# Patient Record
Sex: Male | Born: 1950 | Race: White | Hispanic: No | Marital: Married | Smoking: Former smoker
Health system: Southern US, Community
[De-identification: ages and names within clinical notes are randomized; demographics above are authoritative.]

## PROBLEM LIST (undated history)

## (undated) DIAGNOSIS — N400 Enlarged prostate without lower urinary tract symptoms: Secondary | ICD-10-CM

## (undated) DIAGNOSIS — G709 Myoneural disorder, unspecified: Secondary | ICD-10-CM

## (undated) DIAGNOSIS — F419 Anxiety disorder, unspecified: Secondary | ICD-10-CM

## (undated) DIAGNOSIS — M1990 Unspecified osteoarthritis, unspecified site: Secondary | ICD-10-CM

## (undated) DIAGNOSIS — F32A Depression, unspecified: Secondary | ICD-10-CM

## (undated) DIAGNOSIS — K219 Gastro-esophageal reflux disease without esophagitis: Secondary | ICD-10-CM

## (undated) DIAGNOSIS — G809 Cerebral palsy, unspecified: Secondary | ICD-10-CM

## (undated) DIAGNOSIS — F329 Major depressive disorder, single episode, unspecified: Secondary | ICD-10-CM

## (undated) DIAGNOSIS — I10 Essential (primary) hypertension: Secondary | ICD-10-CM

## (undated) DIAGNOSIS — R0600 Dyspnea, unspecified: Secondary | ICD-10-CM

## (undated) DIAGNOSIS — Z8679 Personal history of other diseases of the circulatory system: Secondary | ICD-10-CM

## (undated) DIAGNOSIS — E785 Hyperlipidemia, unspecified: Secondary | ICD-10-CM

## (undated) DIAGNOSIS — Z8669 Personal history of other diseases of the nervous system and sense organs: Secondary | ICD-10-CM

## (undated) DIAGNOSIS — E119 Type 2 diabetes mellitus without complications: Secondary | ICD-10-CM

## (undated) HISTORY — DX: Personal history of other diseases of the nervous system and sense organs: Z86.69

## (undated) HISTORY — DX: Gastro-esophageal reflux disease without esophagitis: K21.9

## (undated) HISTORY — DX: Anxiety disorder, unspecified: F41.9

## (undated) HISTORY — PX: COLONOSCOPY: SHX174

## (undated) HISTORY — PX: APPENDECTOMY: SHX54

## (undated) HISTORY — DX: Hyperlipidemia, unspecified: E78.5

## (undated) HISTORY — DX: Cerebral palsy, unspecified: G80.9

## (undated) HISTORY — DX: Type 2 diabetes mellitus without complications: E11.9

## (undated) HISTORY — DX: Essential (primary) hypertension: I10

## (undated) HISTORY — PX: UPPER GASTROINTESTINAL ENDOSCOPY: SHX188

## (undated) HISTORY — PX: POLYPECTOMY: SHX149

## (undated) HISTORY — DX: Benign prostatic hyperplasia without lower urinary tract symptoms: N40.0

## (undated) HISTORY — DX: Depression, unspecified: F32.A

## (undated) HISTORY — DX: Unspecified osteoarthritis, unspecified site: M19.90

## (undated) HISTORY — DX: Personal history of other diseases of the circulatory system: Z86.79

## (undated) HISTORY — PX: ATRIAL FIBRILLATION ABLATION: EP1191

## (undated) HISTORY — DX: Myoneural disorder, unspecified: G70.9

---
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
## 2008-01-29 DIAGNOSIS — I499 Cardiac arrhythmia, unspecified: Secondary | ICD-10-CM

## 2008-01-29 HISTORY — DX: Cardiac arrhythmia, unspecified: I49.9

## 2011-01-29 HISTORY — PX: CARDIAC CATHETERIZATION: SHX172

## 2016-05-15 ENCOUNTER — Other Ambulatory Visit: Payer: Self-pay | Admitting: Internal Medicine

## 2016-05-15 DIAGNOSIS — Z87891 Personal history of nicotine dependence: Secondary | ICD-10-CM

## 2016-05-20 ENCOUNTER — Ambulatory Visit: Payer: Self-pay

## 2016-06-14 ENCOUNTER — Encounter: Payer: Self-pay | Admitting: Internal Medicine

## 2017-05-30 ENCOUNTER — Other Ambulatory Visit: Payer: Self-pay | Admitting: Internal Medicine

## 2017-05-30 DIAGNOSIS — Z87891 Personal history of nicotine dependence: Secondary | ICD-10-CM

## 2017-06-13 ENCOUNTER — Ambulatory Visit: Payer: Medicare Other | Admitting: Podiatry

## 2017-06-13 ENCOUNTER — Ambulatory Visit (INDEPENDENT_AMBULATORY_CARE_PROVIDER_SITE_OTHER): Payer: Medicare Other

## 2017-06-13 DIAGNOSIS — M79672 Pain in left foot: Secondary | ICD-10-CM | POA: Diagnosis not present

## 2017-06-13 DIAGNOSIS — M779 Enthesopathy, unspecified: Secondary | ICD-10-CM | POA: Diagnosis not present

## 2017-06-13 DIAGNOSIS — M7742 Metatarsalgia, left foot: Secondary | ICD-10-CM

## 2017-06-13 MED ORDER — MELOXICAM 7.5 MG PO TABS: 7.5000 mg | ORAL_TABLET | Freq: Every day | ORAL | 0 refills | Status: DC

## 2017-06-20 ENCOUNTER — Encounter: Payer: Self-pay | Admitting: Internal Medicine

## 2017-07-24 ENCOUNTER — Other Ambulatory Visit: Payer: Self-pay | Admitting: Podiatry

## 2017-08-13 ENCOUNTER — Other Ambulatory Visit: Payer: Self-pay | Admitting: Podiatry

## 2017-08-17 NOTE — Progress Notes (Signed)
  Subjective:  Patient ID: Bradley Ballard, male    DOB: 11/29/1950,  MRN: [id number]  Chief Complaint  Patient presents with  . Foot Pain    left foot pain   67 y.o. male presents with the above complaint.  Reports sharp pain in the left heel and ball of foot.  No past medical history on file.  Current Outpatient Medications:  .  meloxicam (MOBIC) 7.5 MG tablet, TAKE 1 TABLET(7.5 MG) BY MOUTH DAILY, Disp: 90 tablet, Rfl: 0  Allergies not on file Review of Systems: Negative except as noted in the HPI. Denies N/V/F/Ch. Objective:  There were no vitals filed for this visit. General AA&O x3. Normal mood and affect.  Vascular Dorsalis pedis and posterior tibial pulses  present 2+ bilaterally  Capillary refill normal to all digits. Pedal hair growth normal.  Neurologic Epicritic sensation grossly present.  Dermatologic No open lesions. Interspaces clear of maceration. Nails well groomed and normal in appearance.  Orthopedic: MMT 5/5 in dorsiflexion, plantarflexion, inversion, and eversion. Normal joint ROM without pain or crepitus. Pain palpation about the ball of foot left   Assessment & Plan:  Patient was evaluated and treated and all questions answered.  Metatarsalgia -X-rays taken reviewed no acute fractures dislocations -Rx Mobic -Dispensed power step inserts  Return in about 6 weeks (around 07/25/2017).

## 2017-10-01 ENCOUNTER — Other Ambulatory Visit: Payer: Self-pay | Admitting: Sports Medicine

## 2018-02-12 ENCOUNTER — Other Ambulatory Visit: Payer: Self-pay | Admitting: Sports Medicine

## 2019-01-12 ENCOUNTER — Encounter: Payer: Self-pay | Admitting: Gastroenterology

## 2019-01-12 ENCOUNTER — Other Ambulatory Visit: Payer: Self-pay | Admitting: Internal Medicine

## 2019-01-12 DIAGNOSIS — Z0000 Encounter for general adult medical examination without abnormal findings: Secondary | ICD-10-CM

## 2019-01-28 ENCOUNTER — Ambulatory Visit
Admission: RE | Admit: 2019-01-28 | Discharge: 2019-01-28 | Disposition: A | Discharge status: Home / Self Care | Payer: Medicare Other | Source: Ambulatory Visit | Attending: Internal Medicine | Admitting: Internal Medicine

## 2019-01-28 DIAGNOSIS — Z0000 Encounter for general adult medical examination without abnormal findings: Secondary | ICD-10-CM

## 2019-02-05 ENCOUNTER — Other Ambulatory Visit: Payer: Self-pay

## 2019-02-05 ENCOUNTER — Ambulatory Visit (AMBULATORY_SURGERY_CENTER): Payer: Medicare Other | Admitting: *Deleted

## 2019-02-05 VITALS — Ht 69.5 in | Wt 275.0 lb

## 2019-02-05 DIAGNOSIS — Z1211 Encounter for screening for malignant neoplasm of colon: Secondary | ICD-10-CM

## 2019-02-05 DIAGNOSIS — Z1159 Encounter for screening for other viral diseases: Secondary | ICD-10-CM

## 2019-02-05 MED ORDER — SUPREP BOWEL PREP KIT 17.5-3.13-1.6 GM/177ML PO SOLN: 1.0000 | Freq: Once | ORAL | 0 refills | Status: AC

## 2019-02-05 NOTE — Progress Notes (Signed)
Pt verified name, DOB, address and insurance during PV today. Pt mailed instruction packet to included paper to complete and mail back to [facility] with addressed and stamped envelope, Emmi video, copy of consent form to read and not return, and instructions. PV completed over the phone. Pt encouraged to call with questions or issues    No egg or soy allergy known to patient  No issues with past sedation with any surgeries  or procedures, no intubation problems  No diet pills per patient No home 02 use per patient  No blood thinners per patient  Pt denies issues with constipation  No A fib or A flutter  EMMI video sent to pt's e mail   Due to the COVID-19 pandemic we are asking patients to follow these guidelines. Please only bring one care partner. Please be aware that your care partner may wait in the car in the parking lot or if they feel like they will be too hot to wait in the car, they may wait in the lobby on the 4th floor. All care partners are required to wear a mask the entire time (we do not have any that we can provide them), they need to practice social distancing, and we will do a Covid check for all patient's and care partners when you arrive. Also we will check their temperature and your temperature. If the care partner waits in their car they need to stay in the parking lot the entire time and we will call them on their cell phone when the patient is ready for discharge so they can bring the car to the front of the building. Also all patient's will need to wear a mask into building.  

## 2019-02-09 ENCOUNTER — Encounter: Payer: Self-pay | Admitting: Gastroenterology

## 2019-02-16 ENCOUNTER — Ambulatory Visit (INDEPENDENT_AMBULATORY_CARE_PROVIDER_SITE_OTHER): Payer: Medicare Other

## 2019-02-16 ENCOUNTER — Other Ambulatory Visit: Payer: Self-pay | Admitting: Gastroenterology

## 2019-02-16 DIAGNOSIS — Z1159 Encounter for screening for other viral diseases: Secondary | ICD-10-CM

## 2019-02-17 LAB — SARS CORONAVIRUS 2 (TAT 6-24 HRS): SARS Coronavirus 2: NEGATIVE

## 2019-02-19 ENCOUNTER — Other Ambulatory Visit: Payer: Self-pay

## 2019-02-19 ENCOUNTER — Encounter: Payer: Self-pay | Admitting: Gastroenterology

## 2019-02-19 ENCOUNTER — Ambulatory Visit (AMBULATORY_SURGERY_CENTER): Payer: Medicare Other | Admitting: Gastroenterology

## 2019-02-19 VITALS — BP 101/62 | HR 56 | Temp 97.6°F | Resp 13 | Ht 69.0 in | Wt 275.0 lb

## 2019-02-19 DIAGNOSIS — Z1211 Encounter for screening for malignant neoplasm of colon: Secondary | ICD-10-CM

## 2019-02-19 MED ORDER — SODIUM CHLORIDE 0.9 % IV SOLN: 500.0000 mL | Freq: Once | INTRAVENOUS | Status: DC

## 2019-02-19 NOTE — Patient Instructions (Signed)
Handout on diverticulosis given   YOU HAD AN ENDOSCOPIC PROCEDURE TODAY AT THE  ENDOSCOPY CENTER:   Refer to the procedure report that was given to you for any specific questions about what was found during the examination.  If the procedure report does not answer your questions, please call your gastroenterologist to clarify.  If you requested that your care partner not be given the details of your procedure findings, then the procedure report has been included in a sealed envelope for you to review at your convenience later.  YOU SHOULD EXPECT: Some feelings of bloating in the abdomen. Passage of more gas than usual.  Walking can help get rid of the air that was put into your GI tract during the procedure and reduce the bloating. If you had a lower endoscopy (such as a colonoscopy or flexible sigmoidoscopy) you may notice spotting of blood in your stool or on the toilet paper. If you underwent a bowel prep for your procedure, you may not have a normal bowel movement for a few days.  Please Note:  You might notice some irritation and congestion in your nose or some drainage.  This is from the oxygen used during your procedure.  There is no need for concern and it should clear up in a day or so.  SYMPTOMS TO REPORT IMMEDIATELY:   Following lower endoscopy (colonoscopy or flexible sigmoidoscopy):  Excessive amounts of blood in the stool  Significant tenderness or worsening of abdominal pains  Swelling of the abdomen that is new, acute  Fever of 100F or higher   For urgent or emergent issues, a gastroenterologist can be reached at any hour by calling [phone].   DIET:  We do recommend a small meal at first, but then you may proceed to your regular diet.  Drink plenty of fluids but you should avoid alcoholic beverages for 24 hours.  ACTIVITY:  You should plan to take it easy for the rest of today and you should NOT DRIVE or use heavy machinery until tomorrow (because of the  sedation medicines used during the test).    FOLLOW UP: Our staff will call the number listed on your records 48-72 hours following your procedure to check on you and address any questions or concerns that you may have regarding the information given to you following your procedure. If we do not reach you, we will leave a message.  We will attempt to reach you two times.  During this call, we will ask if you have developed any symptoms of COVID 19. If you develop any symptoms (ie: fever, flu-like symptoms, shortness of breath, cough etc.) before then, please call [phone].  If you test positive for Covid 19 in the 2 weeks post procedure, please call and report this information to us.    If any biopsies were taken you will be contacted by phone or by letter within the next 1-3 weeks.  Please call us at [phone] if you have not heard about the biopsies in 3 weeks.    SIGNATURES/CONFIDENTIALITY: You and/or your care partner have signed paperwork which will be entered into your electronic medical record.  These signatures attest to the fact that that the information above on your After Visit Summary has been reviewed and is understood.  Full responsibility of the confidentiality of this discharge information lies with you and/or your care-partner. 

## 2019-02-19 NOTE — Op Note (Signed)
[employer] Patient Name: Bradley Ballard Procedure Date: 02/19/2019 10:28 AM MRN: [id number] Endoscopist: Jaconita. Loletha Carrow , MD Age: 69 Referring MD:  Date of Birth: 1950/07/27 Gender: Male Account #: [id number] Procedure:                Colonoscopy Indications:              Screening for colorectal malignant neoplasm Medicines:                Monitored Anesthesia Care Procedure:                Pre-Anesthesia Assessment:                           - Prior to the procedure, a History and Physical                            was performed, and patient medications and                            allergies were reviewed. The patient's tolerance of                            previous anesthesia was also reviewed. The risks                            and benefits of the procedure and the sedation                            options and risks were discussed with the patient.                            All questions were answered, and informed consent                            was obtained. Prior Anticoagulants: The patient has                            taken no previous anticoagulant or antiplatelet                            agents except for aspirin. ASA Grade Assessment:                            III - A patient with severe systemic disease. After                            reviewing the risks and benefits, the patient was                            deemed in satisfactory condition to undergo the                            procedure.  After obtaining informed consent, the colonoscope                            was passed under direct vision. Throughout the                            procedure, the patient's blood pressure, pulse, and                            oxygen saturations were monitored continuously. The                            Colonoscope was introduced through the anus and                            advanced to the the cecum, identified by                       appendiceal orifice and ileocecal valve. The                            colonoscopy was performed without difficulty. The                            patient tolerated the procedure well. The quality                            of the bowel preparation was good. The ileocecal                            valve, appendiceal orifice, and rectum were                            photographed. Scope In: 10:56:42 AM Scope Out: 11:08:11 AM Scope Withdrawal Time: 0 hours 8 minutes 13 seconds  Total Procedure Duration: 0 hours 11 minutes 29 seconds  Findings:                 The perianal and digital rectal examinations were                            normal.                           Multiple diverticula were found in the left colon.                           The exam was otherwise without abnormality on                            direct and retroflexion views. Complications:            No immediate complications. Estimated Blood Loss:     Estimated blood loss: none. Impression:               - Diverticulosis in the left colon.                           -  The examination was otherwise normal on direct                            and retroflexion views.                           - No specimens collected. Recommendation:           - Patient has a contact number available for                            emergencies. The signs and symptoms of potential                            delayed complications were discussed with the                            patient. Return to normal activities tomorrow.                            Written discharge instructions were provided to the                            patient.                           - Resume previous diet.                           - Continue present medications.                           - Based on current guidelines, no repeat screening                            colonoscopy. Kruz Chiu L. Myrtie Neither, MD 02/19/2019 11:15:17 AM This report  has been signed electronically.

## 2019-02-19 NOTE — Progress Notes (Signed)
A and O x3. Report to RN. Tolerated MAC anesthesia well.

## 2019-02-19 NOTE — Progress Notes (Signed)
Temp  LC  VS  KA  Pt's states no medical or surgical changes since previsit or office visit.  Admitting RN reviewed

## 2019-02-23 ENCOUNTER — Telehealth: Payer: Self-pay

## 2019-02-23 ENCOUNTER — Telehealth: Payer: Self-pay | Admitting: *Deleted

## 2019-02-23 NOTE — Telephone Encounter (Signed)
  Follow up Call-  Call back number 02/19/2019  Post procedure Call Back phone  # [phone]  Permission to leave phone message Yes  Some recent data might be hidden     Patient questions:  Do you have a fever, pain , or abdominal swelling? No. Pain Score  0 *  Have you tolerated food without any problems? Yes.    Have you been able to return to your normal activities? Yes.    Do you have any questions about your discharge instructions: Diet   No. Medications  No. Follow up visit  No.  Do you have questions or concerns about your Care? No.  Actions: * If pain score is 4 or above: No action needed, pain <4.  1. Have you developed a fever since your procedure? no  2.   Have you had an respiratory symptoms (SOB or cough) since your procedure? no  3.   Have you tested positive for COVID 19 since your procedure no  4.   Have you had any family members/close contacts diagnosed with the COVID 19 since your procedure?  no   If yes to any of these questions please route to Laverna Peace, RN and Jennye Boroughs, Charity fundraiser.

## 2019-02-23 NOTE — Telephone Encounter (Signed)
Attempted to reach patient for post-procedure f/u call. No answer. Left message that we will make another attempt to reach him again later today and for him to please not hesitate to call us if he has any questions/concerns regarding his care. 

## 2019-03-24 ENCOUNTER — Other Ambulatory Visit: Payer: Self-pay | Admitting: Urology

## 2019-03-29 NOTE — Patient Instructions (Addendum)
DUE TO COVID-19 ONLY ONE VISITOR IS ALLOWED TO COME WITH YOU AND STAY IN THE WAITING ROOM ONLY DURING PRE OP AND PROCEDURE DAY OF SURGERY. THE 1 VISITOR MAY VISIT WITH YOU AFTER SURGERY IN YOUR PRIVATE ROOM DURING VISITING HOURS ONLY!  YOU NEED TO HAVE A COVID 19 TEST ON: 04/02/2019 @ 8:50 am  , THIS TEST MUST BE DONE BEFORE SURGERY, COME  [place], [place] , [address].  ([place]) ONCE YOUR COVID TEST IS COMPLETED, PLEASE BEGIN THE QUARANTINE INSTRUCTIONS AS OUTLINED IN YOUR HANDOUT.                Delroy Ordway   Your procedure is scheduled on: 04/06/2019   Report to [facility] Main  Entrance   Report to admitting at : 6:45 AM     Call this number if you have problems the morning of surgery [phone]    Remember: Do not eat SOLID FOOD :After Midnight.fROM MIDNIGHT UNTIL 4:30 AM YOU CAN HAVE CLEAR LIQUIDS.     CLEAR LIQUID DIET   Foods Allowed                                                                     Foods Excluded  Coffee and tea, regular and decaf                             liquids that you cannot  Plain Jell-O any favor except red or purple                                           see through such as: Fruit ices (not with fruit pulp)                                     milk, soups, orange juice  Iced Popsicles                                    All solid food Carbonated beverages, regular and diet                                    Cranberry, grape and apple juices Sports drinks like Gatorade Lightly seasoned clear broth or consume(fat free) Sugar, honey syrup  Sample Menu Breakfast                                Lunch                                     Supper Cranberry juice                    Beef broth  Chicken broth Jell-O                                     Grape juice                           Apple juice Coffee or tea                        Jell-O                                       Popsicle                                                Coffee or tea                        Coffee or tea  _____________________________________________________________________   How to Manage Your Diabetes Before and After Surgery  Why is it important to control my blood sugar before and after surgery? . Improving blood sugar levels before and after surgery helps healing and can limit problems. . A way of improving blood sugar control is eating a healthy diet by: o  Eating less sugar and carbohydrates o  Increasing activity/exercise o  Talking with your doctor about reaching your blood sugar goals . High blood sugars (greater than 180 mg/dL) can raise your risk of infections and slow your recovery, so you will need to focus on controlling your diabetes during the weeks before surgery. . Make sure that the doctor who takes care of your diabetes knows about your planned surgery including the date and location.  How do I manage my blood sugar before surgery? . Check your blood sugar at least 4 times a day, starting 2 days before surgery, to make sure that the level is not too high or low. o Check your blood sugar the morning of your surgery when you wake up and every 2 hours until you get to the Short Stay unit. . If your blood sugar is less than 70 mg/dL, you will need to treat for low blood sugar: o Do not take insulin. o Treat a low blood sugar (less than 70 mg/dL) with  cup of clear juice (cranberry or apple), 4 glucose tablets, OR glucose gel. o Recheck blood sugar in 15 minutes after treatment (to make sure it is greater than 70 mg/dL). If your blood sugar is not greater than 70 mg/dL on recheck, call [phone] for further instructions. . Report your blood sugar to the short stay nurse when you get to Short Stay.  . If you are admitted to the hospital after surgery: o Your blood sugar will be checked by the staff and you will probably be given insulin after surgery (instead of  oral diabetes medicines) to make sure you have good blood sugar levels. o The goal for blood sugar control after surgery is 80-180 mg/dL.   WHAT DO I DO ABOUT MY DIABETES MEDICATION?  Marland Kitchen Do not take oral diabetes medicines (pills) the morning of surgery.  . THE DAY BEFORE SURGERY: TAKE METFORMIN AS USUAL     .  THE MORNING OF SURGERY: DO NOT TAKE METFORMIN     Take these medicines the morning of surgery with A SIP OF WATER: BUPROPION,DULOXETINE,FENOFIBRATE,GABAPENTIN,TAMSULOSIN.FLONASE  AND EYE DROPS AS NEEDED.   DO NOT TAKE ANY DIABETIC MEDICATIONS DAY OF YOUR SURGERY       BRUSH YOUR TEETH MORNING OF SURGERY AND RINSE YOUR MOUTH OUT, NO CHEWING GUM CANDY OR MINTS.                             You may not have any metal on your body including hair pins and              piercings  Do not wear jewelry, lotions, powders or perfumes, deodorant             Men may shave face and neck.   Do not bring valuables to the hospital. [facility] IS NOT             RESPONSIBLE   FOR VALUABLES.  Contacts, dentures or bridgework may not be worn into surgery.  Leave suitcase in the car. After surgery it may be brought to your room.     Patients discharged the day of surgery will not be allowed to drive home. IF YOU ARE HAVING SURGERY AND GOING HOME THE SAME DAY, YOU MUST HAVE AN ADULT TO DRIVE YOU HOME AND BE WITH YOU FOR 24 HOURS. YOU MAY GO HOME BY TAXI OR UBER OR ORTHERWISE, BUT AN ADULT MUST ACCOMPANY YOU HOME AND STAY WITH YOU FOR 24 HOURS.  Name and phone number of your driver:  Special Instructions: N/A              Please read over the following fact sheets you were given: _____________________________________________________________________             [facility] - Preparing for Surgery Before surgery, you can play an important role.  Because skin is not sterile, your skin needs to be as free of germs as possible.  You can reduce the number of germs on your skin by washing with  CHG (chlorahexidine gluconate) soap before surgery.  CHG is an antiseptic cleaner which kills germs and bonds with the skin to continue killing germs even after washing. Please DO NOT use if you have an allergy to CHG or antibacterial soaps.  If your skin becomes reddened/irritated stop using the CHG and inform your nurse when you arrive at Short Stay. Do not shave (including legs and underarms) for at least 48 hours prior to the first CHG shower.  You may shave your face/neck. Please follow these instructions carefully:  1.  Shower with CHG Soap the night before surgery and the  morning of Surgery.  2.  If you choose to wash your hair, wash your hair first as usual with your  normal  shampoo.  3.  After you shampoo, rinse your hair and body thoroughly to remove the  shampoo.                           4.  Use CHG as you would any other liquid soap.  You can apply chg directly  to the skin and wash                       Gently with a scrungie or clean washcloth.  5.  Apply the CHG Soap to your  body ONLY FROM THE NECK DOWN.   Do not use on face/ open                           Wound or open sores. Avoid contact with eyes, ears mouth and genitals (private parts).                       Wash face,  Genitals (private parts) with your normal soap.             6.  Wash thoroughly, paying special attention to the area where your surgery  will be performed.  7.  Thoroughly rinse your body with warm water from the neck down.  8.  DO NOT shower/wash with your normal soap after using and rinsing off  the CHG Soap.                9.  Pat yourself dry with a clean towel.            10.  Wear clean pajamas.            11.  Place clean sheets on your bed the night of your first shower and do not  sleep with pets. Day of Surgery : Do not apply any lotions/deodorants the morning of surgery.  Please wear clean clothes to the hospital/surgery center.  FAILURE TO FOLLOW THESE INSTRUCTIONS MAY RESULT IN THE CANCELLATION  OF YOUR SURGERY PATIENT SIGNATURE_________________________________  NURSE SIGNATURE__________________________________  ________________________________________________________________________

## 2019-03-30 ENCOUNTER — Encounter (HOSPITAL_COMMUNITY): Payer: Self-pay

## 2019-03-30 ENCOUNTER — Other Ambulatory Visit: Payer: Self-pay

## 2019-03-30 ENCOUNTER — Encounter (HOSPITAL_COMMUNITY)
Admission: RE | Admit: 2019-03-30 | Discharge: 2019-03-30 | Disposition: A | Discharge status: Home / Self Care | Payer: Medicare Other | Source: Ambulatory Visit | Attending: Urology | Admitting: Urology

## 2019-03-30 DIAGNOSIS — Z01818 Encounter for other preprocedural examination: Secondary | ICD-10-CM | POA: Insufficient documentation

## 2019-03-30 HISTORY — DX: Dyspnea, unspecified: R06.00

## 2019-03-30 NOTE — Progress Notes (Signed)
PCP - Dr. Cyndie Mull. Cardiologist -   Chest x-ray -  EKG -  Stress Test -  ECHO -  Cardiac Cath -   Sleep Study -  CPAP -   Fasting Blood Sugar -  Checks Blood Sugar _____ times a day  Blood Thinner Instructions: Aspirin Instructions: Pt. Said he will stop the day before surgery. Last Dose:  Anesthesia review:   Patient denies shortness of breath, fever, cough and chest pain at PAT appointment   Patient verbalized understanding of instructions that were given to them at the PAT appointment. Patient was also instructed that they will need to review over the PAT instructions again at home before surgery.

## 2019-03-31 ENCOUNTER — Encounter (HOSPITAL_COMMUNITY)
Admission: RE | Admit: 2019-03-31 | Discharge: 2019-03-31 | Disposition: A | Discharge status: Home / Self Care | Payer: Medicare Other | Source: Ambulatory Visit | Attending: Urology | Admitting: Urology

## 2019-03-31 ENCOUNTER — Encounter (HOSPITAL_COMMUNITY): Payer: Self-pay | Admitting: Physician Assistant

## 2019-03-31 NOTE — Progress Notes (Signed)
RN called pt. Since he did not show for his lab work.Pt. did not answer.Then RN call [facility] and find out that the surgery has been cancelled.

## 2019-04-02 ENCOUNTER — Other Ambulatory Visit (HOSPITAL_COMMUNITY): Payer: Medicare Other

## 2019-04-06 ENCOUNTER — Encounter (HOSPITAL_COMMUNITY): Admission: RE | Payer: Self-pay | Source: Home / Self Care

## 2019-04-06 ENCOUNTER — Inpatient Hospital Stay (HOSPITAL_COMMUNITY): Admission: RE | Admit: 2019-04-06 | Payer: Medicare Other | Source: Home / Self Care | Admitting: Urology

## 2019-04-06 SURGERY — TURP (TRANSURETHRAL RESECTION OF PROSTATE)
Anesthesia: General

## 2019-04-09 ENCOUNTER — Other Ambulatory Visit: Payer: Self-pay | Admitting: Urology

## 2019-05-03 ENCOUNTER — Other Ambulatory Visit: Payer: Self-pay | Admitting: Urology

## 2019-05-03 NOTE — Patient Instructions (Addendum)
DUE TO COVID-19 ONLY ONE VISITOR IS ALLOWED TO COME WITH YOU AND STAY IN THE WAITING ROOM ONLY DURING PRE OP AND PROCEDURE DAY OF SURGERY. THE 1 VISITOR MAY VISIT WITH YOU AFTER SURGERY IN YOUR PRIVATE ROOM DURING VISITING HOURS ONLY!  YOU NEED TO HAVE A COVID 19 TEST ON_4/9______ @_12 :30 p______, THIS TEST MUST BE DONE BEFORE SURGERY, COME  [address], [place] , [address].  (Bradley Ballard) ONCE YOUR COVID TEST IS COMPLETED, PLEASE BEGIN THE QUARANTINE INSTRUCTIONS AS OUTLINED IN YOUR HANDOUT.                Bradley Ballard    Your procedure is scheduled on: 05/11/19   Report to [facility] Main  Entrance   Report to admitting at  6:30 AM     Call this number if you have problems the morning of surgery [phone]    Remember: Do not eat food or drink liquids :After Midnight.   BRUSH YOUR TEETH MORNING OF SURGERY AND RINSE YOUR MOUTH OUT, NO CHEWING GUM CANDY OR MINTS.     Take these medicines the morning of surgery with A SIP OF WATER: Gabapentin, Wellbutrin, Cymbalya,Sildenafil, Finesteride, Tamsulosin and flonase if needed. Eye drops.  DO NOT TAKE ANY DIABETIC MEDICATIONS DAY OF YOUR SURGERY  How to Manage Your Diabetes Before and After Surgery  Why is it important to control my blood sugar before and after surgery? . Improving blood sugar levels before and after surgery helps healing and can limit problems. . A way of improving blood sugar control is eating a healthy diet by: o  Eating less sugar and carbohydrates o  Increasing activity/exercise o  Talking with your doctor about reaching your blood sugar goals . High blood sugars (greater than 180 mg/dL) can raise your risk of infections and slow your recovery, so you will need to focus on controlling your diabetes during the weeks before surgery. . Make sure that the doctor who takes care of your diabetes knows about your planned surgery including the date and location.  How do I manage my blood  sugar before surgery? . Check your blood sugar at least 4 times a day, starting 2 days before surgery, to make sure that the level is not too high or low. o Check your blood sugar the morning of your surgery when you wake up and every 2 hours until you get to the Short Stay unit. . If your blood sugar is less than 70 mg/dL, you will need to treat for low blood sugar: o Do not take insulin. o Treat a low blood sugar (less than 70 mg/dL) with  cup of clear juice (cranberry or apple), 4 glucose tablets, OR glucose gel. o Recheck blood sugar in 15 minutes after treatment (to make sure it is greater than 70 mg/dL). If your blood sugar is not greater than 70 mg/dL on recheck, call [phone] for further instructions. . Report your blood sugar to the short stay nurse when you get to Short Stay.  . If you are admitted to the hospital after surgery: o Your blood sugar will be checked by the staff and you will probably be given insulin after surgery (instead of oral diabetes medicines) to make sure you have good blood sugar levels. o The goal for blood sugar control after surgery is 80-180 mg/dL.   WHAT DO I DO ABOUT MY DIABETES MEDICATION?  Marland Kitchen Do not take oral diabetes medicines (pills) the morning of surgery.  Marland Kitchen  You may not have any metal on your body               piercings  Do not wear jewelry, , lotions, powders or  deodorant                Men may shave face and neck.   Do not bring valuables to the hospital. [facility] IS NOT             RESPONSIBLE   FOR VALUABLES.  Contacts, dentures or bridgework may not be worn into surgery.     Special Instructions: N/A              Please read over the following fact sheets you were given: _____________________________________________________________________             [facility] - Preparing for Surgery Before surgery, you can play an important role .  Because skin is not sterile, your skin needs to be  as free of germs as possible.  You can reduce the number of germs on your skin by washing with CHG (chlorahexidine gluconate) soap before surgery.   CHG is an antiseptic cleaner which kills germs and bonds with the skin to continue killing germs even after washing. Please DO NOT use if you have an allergy to CHG or antibacterial soaps.   If your skin becomes reddened/irritated stop using the CHG and inform your nurse when you arrive at Short Stay.  You may shave your face/neck.  Please follow these instructions carefully:  1.  Shower with CHG Soap the night before surgery and the  morning of Surgery.  2.  If you choose to wash your hair, wash your hair first as usual with your  normal  shampoo.  3.  After you shampoo, rinse your hair and body thoroughly to remove the  shampoo.                                        4.  Use CHG as you would any other liquid soap.  You can apply chg directly  to the skin and wash                       Gently with a scrungie or clean washcloth.  5.  Apply the CHG Soap to your body ONLY FROM THE NECK DOWN.   Do not use on face/ open                           Wound or open sores. Avoid contact with eyes, ears mouth and genitals (private parts).                       Wash face,  Genitals (private parts) with your normal soap.             6.  Wash thoroughly, paying special attention to the area where your surgery  will be performed.  7.  Thoroughly rinse your body with warm water from the neck down.  8.  DO NOT shower/wash with your normal soap after using and rinsing off  the CHG Soap.             9.  Pat yourself dry with a clean towel.            10.  Wear clean pajamas.            11.  Place clean sheets on your bed the night of your first shower and do not  sleep with pets. Day of Surgery : Do not apply any lotions/deodorants the morning of surgery.  Please wear clean clothes to the hospital/surgery center.  FAILURE TO FOLLOW THESE INSTRUCTIONS MAY RESULT IN  THE CANCELLATION OF YOUR SURGERY PATIENT SIGNATURE_________________________________  NURSE SIGNATURE__________________________________  ________________________________________________________________________

## 2019-05-04 ENCOUNTER — Encounter (HOSPITAL_COMMUNITY)
Admission: RE | Admit: 2019-05-04 | Discharge: 2019-05-04 | Disposition: A | Discharge status: Home / Self Care | Payer: Medicare Other | Source: Ambulatory Visit | Attending: Urology | Admitting: Urology

## 2019-05-04 ENCOUNTER — Other Ambulatory Visit: Payer: Self-pay

## 2019-05-04 ENCOUNTER — Other Ambulatory Visit (HOSPITAL_COMMUNITY): Payer: Medicare Other

## 2019-05-04 ENCOUNTER — Encounter (HOSPITAL_COMMUNITY): Payer: Self-pay

## 2019-05-04 DIAGNOSIS — Z01812 Encounter for preprocedural laboratory examination: Secondary | ICD-10-CM | POA: Diagnosis present

## 2019-05-04 DIAGNOSIS — Z01810 Encounter for preprocedural cardiovascular examination: Secondary | ICD-10-CM | POA: Diagnosis present

## 2019-05-04 DIAGNOSIS — E119 Type 2 diabetes mellitus without complications: Secondary | ICD-10-CM | POA: Insufficient documentation

## 2019-05-04 DIAGNOSIS — I4510 Unspecified right bundle-branch block: Secondary | ICD-10-CM | POA: Insufficient documentation

## 2019-05-04 LAB — BASIC METABOLIC PANEL
Anion gap: 10 (ref 5–15)
BUN: 28 mg/dL — ABNORMAL HIGH (ref 8–23)
CO2: 24 mmol/L (ref 22–32)
Calcium: 10 mg/dL (ref 8.9–10.3)
Chloride: 106 mmol/L (ref 98–111)
Creatinine, Ser: 0.93 mg/dL (ref 0.61–1.24)
GFR calc Af Amer: >60 mL/min (ref >60)
GFR calc non Af Amer: >60 mL/min (ref >60)
Glucose, Bld: 187 mg/dL — ABNORMAL HIGH (ref 70–99)
Potassium: 4.3 mmol/L (ref 3.5–5.1)
Sodium: 140 mmol/L (ref 135–145)

## 2019-05-04 LAB — CBC
HCT: 42.3 % (ref 39.0–52.0)
Hemoglobin: 13.5 g/dL (ref 13.0–17.0)
MCH: 30 pg (ref 26.0–34.0)
MCHC: 31.9 g/dL (ref 30.0–36.0)
MCV: 94 fL (ref 80.0–100.0)
Platelets: 300 10*3/uL (ref 150–400)
RBC: 4.5 MIL/uL (ref 4.22–5.81)
RDW: 12.9 % (ref 11.5–15.5)
WBC: 6.5 10*3/uL (ref 4.0–10.5)
nRBC: 0 % (ref 0.0–0.2)

## 2019-05-04 LAB — HEMOGLOBIN A1C
Hgb A1c MFr Bld: 7.9 % — ABNORMAL HIGH (ref 4.8–5.6)
Mean Plasma Glucose: 180.03 mg/dL

## 2019-05-04 LAB — GLUCOSE, CAPILLARY: Glucose-Capillary: 165 mg/dL — ABNORMAL HIGH (ref 70–99)

## 2019-05-04 NOTE — Progress Notes (Signed)
PCP - dr. Cyndie Mull Cardiologist - no  Chest x-ray - no EKG - 05/04/19 Stress Test -no  ECHO - no Cardiac Cath - ablation 2013  Sleep Study - yes CPAP - no  Fasting Blood Sugar - 128-159 Checks Blood Sugar _____ times a day 1/week  Blood Thinner Instructions:ASA Aspirin Instructions:none. Pt told to call Dr. Arita Miss Last Dose:  Anesthesia review:   Patient denies shortness of breath, fever, cough and chest pain at PAT appointment yes  Patient verbalized understanding of instructions that were given to them at the PAT appointment. Patient was also instructed that they will need to review over the PAT instructions again at home before surgery. yes

## 2019-05-07 ENCOUNTER — Other Ambulatory Visit (HOSPITAL_COMMUNITY): Payer: Medicare Other

## 2019-05-08 ENCOUNTER — Other Ambulatory Visit (HOSPITAL_COMMUNITY): Payer: Medicare Other

## 2019-05-10 ENCOUNTER — Other Ambulatory Visit (HOSPITAL_COMMUNITY)
Admission: RE | Admit: 2019-05-10 | Discharge: 2019-05-10 | Disposition: A | Discharge status: Home / Self Care | Payer: Medicare Other | Source: Ambulatory Visit | Attending: Urology | Admitting: Urology

## 2019-05-10 DIAGNOSIS — Z01812 Encounter for preprocedural laboratory examination: Secondary | ICD-10-CM | POA: Insufficient documentation

## 2019-05-10 DIAGNOSIS — Z20822 Contact with and (suspected) exposure to covid-19: Secondary | ICD-10-CM | POA: Diagnosis not present

## 2019-05-10 LAB — SARS CORONAVIRUS 2 (TAT 6-24 HRS): SARS Coronavirus 2: NEGATIVE

## 2019-05-10 NOTE — H&P (Signed)
CC/HPI: cc: LUTs   03/11/19: 69 year old man with history of BPH and lower urinary tract symptoms on finasteride and Flomax with persistent urinary urgency and urge incontinence. Patient has cystoscopy which showed significantly enlarged lateral lobes with the median lobe. He underwent transrectal ultrasound of prostate today for size which showed 169 g prostate.   02/04/19: 69 year old man with lower urinary tract symptoms on finasteride and Flomax for several years. Patient's biggest issue is urinary urgency and urge incontinence. He reports soiling his clothes 3 times a day as well as at night. He has nocturia 3-4 times at night. His most recent PSA is 1.9 to corrected to 3.84. Patient also complains of ED and has not tried anything for it before. He is able to get an erection after a lot of stimulation sometimes taking over an hour and then has difficulty maintaining it. He is trying to be more intimate with his wife. He denies gross hematuria or UTIs. He does drink milk and water prior to bed.  IPSS: 3, 2, 4, 5, 3, 2, 3 = 22/35  Quality of life 6/6 terrible     ALLERGIES: None   MEDICATIONS: Finasteride 5 mg tablet  Metoprolol Tartrate 25 mg tablet  Myrbetriq 25 mg tablet, extended release 24 hr 1 tablet PO Daily  Tamsulosin Hcl 0.4 mg capsule  Aspirin Ec 81 mg tablet, delayed release  Atorvastatin Calcium 10 mg tablet  Bupropion Xl 300 mg tablet, extended release 24 hr  Duloxetine Hcl 60 mg capsule,delayed release  Fenofibrate 54 mg tablet  Fluticasone Propionate 50 mcg/actuation spray, suspension  Gabapentin 300 mg capsule  Metformin Hcl Er 750 mg tablet, extended release 24 hr     GU PSH: Cystoscopy - 03/04/2019     NON-GU PSH: Appendectomy     GU PMH: BPH w/LUTS, Patient has been on Flomax and finasteride for years. He does notice if he misses a dose of Flomax with worsening urinary stream. He will continue his medications. - 02/04/2019 ED due to arterial insufficiency, Patient  has never tried anything for ED and denies any chest pain or cardiac issues. He does not take any nitrates. I have given him a trial of sildenafil 50 mg p.o. to take 1 hour prior to sexual activity. I counseled on the had a best take and about possible side effects. - 02/04/2019 Nocturia, I recommend limiting fluids prior to bedtime approximately 3 hours. - 02/04/2019 Urge incontinence, Patient's PVR today was 30 mL. We discussed that enlarged prostate can make it more difficult to empty the bladder and worsened urinary urgency. I am going to give him a medication Myrbetriq 25 mg p.o. daily to see if it may help with this. In addition I sent his urine for culture today as there is evidence of bacteria. If it returns with bacterial growth how treat him appropriately for UTI. - 02/04/2019 Urinary Urgency - 02/04/2019    NON-GU PMH: Atrial Fibrillation Hypercholesterolemia Hypertension    FAMILY HISTORY: None   SOCIAL HISTORY: Marital Status: Married Preferred Language: English; Ethnicity: Not Hispanic Or Latino; Race: White Current Smoking Status: Patient does not smoke anymore.   Tobacco Use Assessment Completed: Used Tobacco in last 30 days? Does not use smokeless tobacco. Does drink.  Does not use drugs. Drinks 1 caffeinated drink per day.    REVIEW OF SYSTEMS:    GU Review Male:   Patient reports frequent urination, hard to postpone urination, get up at night to urinate, leakage of urine, stream starts and stops,  trouble starting your stream, have to strain to urinate , and erection problems. Patient denies burning/ pain with urination and penile pain.  Gastrointestinal (Upper):   Patient denies nausea, vomiting, and indigestion/ heartburn.  Gastrointestinal (Lower):   Patient denies diarrhea and constipation.  Constitutional:   Patient reports fatigue. Patient denies fever, night sweats, and weight loss.  Skin:   Patient denies skin rash/ lesion and itching.  Eyes:   Patient reports blurred  vision. Patient denies double vision.  Ears/ Nose/ Throat:   Patient reports sinus problems. Patient denies sore throat.  Hematologic/Lymphatic:   Patient denies swollen glands and easy bruising.  Cardiovascular:   Patient reports leg swelling. Patient denies chest pains.  Respiratory:   Patient reports shortness of breath. Patient denies cough.  Endocrine:   Patient denies excessive thirst.  Musculoskeletal:   Patient reports joint pain. Patient denies back pain.  Neurological:   Patient denies headaches and dizziness.  Psychologic:   Patient denies depression and anxiety.   VITAL SIGNS:      03/11/2019 09:00 AM  Weight 270 lb / 122.47 kg  Height 70 in / 177.8 cm  BP 119/77 mmHg  Pulse 61 /min  Temperature 96.8 F / 36 C  BMI 38.7 kg/m   MULTI-SYSTEM PHYSICAL EXAMINATION:    Constitutional: Well-nourished. No physical deformities. Normally developed. Good grooming.  Neck: Neck symmetrical, not swollen. Normal tracheal position.  Respiratory: No labored breathing, no use of accessory muscles.   Cardiovascular: Normal temperature  Skin: No paleness, no jaundice, no cyanosis. No lesion, no ulcer, no rash.  Neurologic / Psychiatric: Oriented to time, oriented to place, oriented to person. No depression, no anxiety, no agitation.  Gastrointestinal: no tenderness, no rigidity  Eyes: Normal conjunctivae. Normal eyelids.  Ears, Nose, Mouth, and Throat: Left ear no scars, no lesions, no masses. Right ear no scars, no lesions, no masses. Nose no scars, no lesions, no masses. Normal hearing. Normal lips.  Musculoskeletal: Normal gait and station of head and neck.     PAST DATA REVIEWED:  Source Of History:  Patient  Records Review:   POC Tool  Urine Test Review:   Urinalysis  X-Ray Review: Prostate Ultrasound: Reviewed Films. Discussed With Patient.    Notes:                     01/06/2019: BUN 22, creatinine 0.9, PSA 1.98 (3.84 corrected)   PROCEDURES:         Prostate Ultrasound -  88891  Length: 7.6 cm Height: 6.1 cm Width: 7.0 cm Volume: 169.19 ml  Heterogeneous echotexture. Hypoechoic/Complex lesion Left      The transrectal ultrasound probe is introduced into the rectum, and the prostate is visualized. Ultrasonography is utilized throughout the procedure. At the conclusion of the procedure, the ultrasound probe is removed. The patient tolerates the procedure without complication.  Patient confirmed No Neulasta OnPro Device.           Urinalysis Dipstick Dipstick Cont'd  Color: Yellow Bilirubin: Neg mg/dL  Appearance: Clear Ketones: Neg mg/dL  Specific Gravity: 6.945 Blood: Neg ery/uL  pH: <=5.0 Protein: Neg mg/dL  Glucose: Neg mg/dL Urobilinogen: 0.2 mg/dL    Nitrites: Neg    Leukocyte Esterase: Neg leu/uL    ASSESSMENT:      ICD-10 Details  1 GU:   BPH w/LUTS - N40.1 Chronic, Worsening - We reviewed patient's prostate ultrasound as well as the cystoscopy findings which showed significantly enlarged and obstructing prostate. We talked  about intervention such as TURP and rezum. Risks and benefits of the procedure were discussed with the patient including bleeding, infection, need for Foley catheter, need for urinary urgency medication and persistent urinary urgency, retrograde ejaculation, need for future treatment. After discussion of both procedures patient has elected to proceed with a TURP. We will schedule surgery in the next few weeks.  2   ED due to arterial insufficiency - N52.01 Chronic, Stable - Patient has not yet had a chance to pick up the sildenafil prescription. He will me know how it works when he is able to use it.  3   Urge incontinence - N39.41 Chronic, Stable - Patient knows that he may need medication at following the TURP to help with the urinary urgency.   4   Urinary Urgency - R39.15 Chronic, Stable

## 2019-05-11 ENCOUNTER — Inpatient Hospital Stay (HOSPITAL_COMMUNITY): Payer: Medicare Other | Admitting: Anesthesiology

## 2019-05-11 ENCOUNTER — Encounter (HOSPITAL_COMMUNITY)
Admission: RE | Disposition: A | Discharge status: Home / Self Care | Payer: Self-pay | Source: Home / Self Care | Attending: Urology

## 2019-05-11 ENCOUNTER — Ambulatory Visit (HOSPITAL_COMMUNITY)
Admission: RE | Admit: 2019-05-11 | Discharge: 2019-05-11 | Disposition: A | Discharge status: Home / Self Care | Payer: Medicare Other | Attending: Urology | Admitting: Urology

## 2019-05-11 ENCOUNTER — Inpatient Hospital Stay (HOSPITAL_COMMUNITY): Payer: Medicare Other | Admitting: Physician Assistant

## 2019-05-11 ENCOUNTER — Encounter (HOSPITAL_COMMUNITY): Payer: Self-pay | Admitting: Urology

## 2019-05-11 ENCOUNTER — Other Ambulatory Visit: Payer: Self-pay

## 2019-05-11 DIAGNOSIS — F329 Major depressive disorder, single episode, unspecified: Secondary | ICD-10-CM | POA: Diagnosis not present

## 2019-05-11 DIAGNOSIS — F419 Anxiety disorder, unspecified: Secondary | ICD-10-CM | POA: Diagnosis not present

## 2019-05-11 DIAGNOSIS — I4891 Unspecified atrial fibrillation: Secondary | ICD-10-CM | POA: Diagnosis not present

## 2019-05-11 DIAGNOSIS — Z79899 Other long term (current) drug therapy: Secondary | ICD-10-CM | POA: Diagnosis not present

## 2019-05-11 DIAGNOSIS — Z7982 Long term (current) use of aspirin: Secondary | ICD-10-CM | POA: Insufficient documentation

## 2019-05-11 DIAGNOSIS — N401 Enlarged prostate with lower urinary tract symptoms: Secondary | ICD-10-CM | POA: Insufficient documentation

## 2019-05-11 DIAGNOSIS — E7800 Pure hypercholesterolemia, unspecified: Secondary | ICD-10-CM | POA: Insufficient documentation

## 2019-05-11 DIAGNOSIS — Z87891 Personal history of nicotine dependence: Secondary | ICD-10-CM | POA: Insufficient documentation

## 2019-05-11 DIAGNOSIS — I10 Essential (primary) hypertension: Secondary | ICD-10-CM | POA: Insufficient documentation

## 2019-05-11 DIAGNOSIS — G808 Other cerebral palsy: Secondary | ICD-10-CM | POA: Insufficient documentation

## 2019-05-11 DIAGNOSIS — N138 Other obstructive and reflux uropathy: Secondary | ICD-10-CM | POA: Diagnosis not present

## 2019-05-11 DIAGNOSIS — Z7984 Long term (current) use of oral hypoglycemic drugs: Secondary | ICD-10-CM | POA: Insufficient documentation

## 2019-05-11 DIAGNOSIS — N5201 Erectile dysfunction due to arterial insufficiency: Secondary | ICD-10-CM | POA: Diagnosis not present

## 2019-05-11 DIAGNOSIS — E119 Type 2 diabetes mellitus without complications: Secondary | ICD-10-CM | POA: Insufficient documentation

## 2019-05-11 HISTORY — PX: TRANSURETHRAL RESECTION OF PROSTATE: SHX73

## 2019-05-11 LAB — URINALYSIS, ROUTINE W REFLEX MICROSCOPIC
Bilirubin Urine: NEGATIVE
Glucose, UA: NEGATIVE mg/dL
Hgb urine dipstick: NEGATIVE
Ketones, ur: NEGATIVE mg/dL
Leukocytes,Ua: NEGATIVE
Nitrite: NEGATIVE
Protein, ur: NEGATIVE mg/dL
Specific Gravity, Urine: 1.019 (ref 1.005–1.030)
pH: 5 (ref 5.0–8.0)

## 2019-05-11 LAB — GLUCOSE, CAPILLARY
Glucose-Capillary: 144 mg/dL — ABNORMAL HIGH (ref 70–99)
Glucose-Capillary: 147 mg/dL — ABNORMAL HIGH (ref 70–99)

## 2019-05-11 SURGERY — TURP (TRANSURETHRAL RESECTION OF PROSTATE)
Anesthesia: General

## 2019-05-11 MED ORDER — MIDAZOLAM HCL 5 MG/5ML IJ SOLN
INTRAMUSCULAR | Status: DC | PRN
  Administered 2019-05-11 (×2): 1 mg via INTRAVENOUS

## 2019-05-11 MED ORDER — SODIUM CHLORIDE 0.9 % IR SOLN
Status: DC | PRN
  Administered 2019-05-11: 27000 mL

## 2019-05-11 MED ORDER — OXYCODONE-ACETAMINOPHEN 5-325 MG PO TABS: 1.0000 | ORAL_TABLET | Freq: Four times a day (QID) | ORAL | 0 refills | Status: AC | PRN

## 2019-05-11 MED ORDER — CIPROFLOXACIN HCL 500 MG PO TABS: 500.0000 mg | ORAL_TABLET | Freq: Once | ORAL | 0 refills | Status: AC

## 2019-05-11 MED ORDER — ONDANSETRON HCL 4 MG/2ML IJ SOLN
INTRAMUSCULAR | Status: DC | PRN
  Administered 2019-05-11: 4 mg via INTRAVENOUS

## 2019-05-11 MED ORDER — PROPOFOL 10 MG/ML IV BOLUS
INTRAVENOUS | Status: AC
  Filled 2019-05-11: qty 20

## 2019-05-11 MED ORDER — ACETAMINOPHEN 500 MG PO TABS
ORAL_TABLET | ORAL | Status: AC
  Filled 2019-05-11: qty 2

## 2019-05-11 MED ORDER — SODIUM CHLORIDE 0.9 % IV SOLN
2.0000 g | INTRAVENOUS | Status: AC
  Administered 2019-05-11: 2 g via INTRAVENOUS
  Filled 2019-05-11: qty 20

## 2019-05-11 MED ORDER — PHENYLEPHRINE HCL-NACL 10-0.9 MG/250ML-% IV SOLN
INTRAVENOUS | Status: DC | PRN
  Administered 2019-05-11: 20 ug/min via INTRAVENOUS

## 2019-05-11 MED ORDER — FENTANYL CITRATE (PF) 100 MCG/2ML IJ SOLN
INTRAMUSCULAR | Status: AC
  Filled 2019-05-11: qty 2

## 2019-05-11 MED ORDER — ONDANSETRON HCL 4 MG/2ML IJ SOLN
INTRAMUSCULAR | Status: AC
  Filled 2019-05-11: qty 2

## 2019-05-11 MED ORDER — FENTANYL CITRATE (PF) 100 MCG/2ML IJ SOLN
INTRAMUSCULAR | Status: DC | PRN
  Administered 2019-05-11 (×3): 25 ug via INTRAVENOUS
  Administered 2019-05-11: 50 ug via INTRAVENOUS
  Administered 2019-05-11: 25 ug via INTRAVENOUS
  Administered 2019-05-11: 50 ug via INTRAVENOUS

## 2019-05-11 MED ORDER — 0.9 % SODIUM CHLORIDE (POUR BTL) OPTIME
TOPICAL | Status: DC | PRN
  Administered 2019-05-11: 1000 mL

## 2019-05-11 MED ORDER — ACETAMINOPHEN 500 MG PO TABS: 1000.0000 mg | ORAL_TABLET | Freq: Once | ORAL | Status: AC

## 2019-05-11 MED ORDER — EPHEDRINE SULFATE 50 MG/ML IJ SOLN
INTRAMUSCULAR | Status: DC | PRN
  Administered 2019-05-11: 7 mg via INTRAVENOUS
  Administered 2019-05-11: 10 mg via INTRAVENOUS
  Administered 2019-05-11: 5 mg via INTRAVENOUS
  Administered 2019-05-11: 7 mg via INTRAVENOUS
  Administered 2019-05-11: 10 mg via INTRAVENOUS
  Administered 2019-05-11: 5 mg via INTRAVENOUS

## 2019-05-11 MED ORDER — PHENYLEPHRINE HCL (PRESSORS) 10 MG/ML IV SOLN
INTRAVENOUS | Status: AC
  Filled 2019-05-11: qty 1

## 2019-05-11 MED ORDER — LACTATED RINGERS IV SOLN: INTRAVENOUS | Status: DC

## 2019-05-11 MED ORDER — PROPOFOL 10 MG/ML IV BOLUS
INTRAVENOUS | Status: DC | PRN
  Administered 2019-05-11 (×2): 20 mg via INTRAVENOUS

## 2019-05-11 MED ORDER — EPHEDRINE 5 MG/ML INJ
INTRAVENOUS | Status: AC
  Filled 2019-05-11: qty 10

## 2019-05-11 MED ORDER — FENTANYL CITRATE (PF) 100 MCG/2ML IJ SOLN
25.0000 ug | INTRAMUSCULAR | Status: DC | PRN
  Administered 2019-05-11: 50 ug via INTRAVENOUS
  Administered 2019-05-11 (×2): 25 ug via INTRAVENOUS

## 2019-05-11 MED ORDER — SENNOSIDES-DOCUSATE SODIUM 8.6-50 MG PO TABS: 2.0000 | ORAL_TABLET | Freq: Every day | ORAL | 1 refills | Status: DC | PRN

## 2019-05-11 MED ORDER — ACETAMINOPHEN 500 MG PO TABS
ORAL_TABLET | ORAL | Status: AC
  Administered 2019-05-11: 1000 mg via ORAL
  Filled 2019-05-11: qty 2

## 2019-05-11 MED ORDER — LIDOCAINE 2% (20 MG/ML) 5 ML SYRINGE
INTRAMUSCULAR | Status: AC
  Filled 2019-05-11: qty 5

## 2019-05-11 MED ORDER — MIDAZOLAM HCL 2 MG/2ML IJ SOLN
INTRAMUSCULAR | Status: AC
  Filled 2019-05-11: qty 2

## 2019-05-11 SURGICAL SUPPLY — 24 items
BAG URINE DRAIN 2000ML AR STRL (UROLOGICAL SUPPLIES) ×2 IMPLANT
BAG URO CATCHER STRL LF (MISCELLANEOUS) ×2 IMPLANT
BLADE SURG 15 STRL LF DISP TIS (BLADE) IMPLANT
BLADE SURG 15 STRL SS (BLADE)
CATH FOLEY 3WAY 30CC 20FR (CATHETERS) ×1 IMPLANT
CATH FOLEY 3WAY 30CC 22FR (CATHETERS) ×1 IMPLANT
CATH HEMA 3WAY 30CC 22FR COUDE (CATHETERS) ×1 IMPLANT
EVACUATOR MICROVAS BLADDER (UROLOGICAL SUPPLIES) ×1 IMPLANT
GLOVE BIO SURGEON STRL SZ 6.5 (GLOVE) ×1 IMPLANT
GLOVE BIOGEL M 8.0 STRL (GLOVE) ×1 IMPLANT
GOWN STRL REUS W/TWL XL LVL3 (GOWN DISPOSABLE) ×2 IMPLANT
HOLDER FOLEY CATH W/STRAP (MISCELLANEOUS) ×1 IMPLANT
KIT TURNOVER KIT A (KITS) IMPLANT
LOOP CUT BIPOLAR 24F LRG (ELECTROSURGICAL) ×1 IMPLANT
MANIFOLD NEPTUNE II (INSTRUMENTS) ×2 IMPLANT
PACK CYSTO (CUSTOM PROCEDURE TRAY) ×2 IMPLANT
PENCIL SMOKE EVACUATOR (MISCELLANEOUS) IMPLANT
PLUG CATH AND CAP STER (CATHETERS) ×1 IMPLANT
SUT ETHILON 3 0 PS 1 (SUTURE) IMPLANT
SYR 30ML LL (SYRINGE) ×1 IMPLANT
SYR TOOMEY IRRIG 70ML (MISCELLANEOUS) ×2
SYRINGE TOOMEY IRRIG 70ML (MISCELLANEOUS) IMPLANT
TUBING CONNECTING 10 (TUBING) ×2 IMPLANT
TUBING UROLOGY SET (TUBING) ×2 IMPLANT

## 2019-05-11 NOTE — Anesthesia Postprocedure Evaluation (Signed)
Anesthesia Post Note  Patient: Bradley Ballard  Procedure(s) Performed: TRANSURETHRAL RESECTION OF THE PROSTATE (TURP) (N/A )     Patient location during evaluation: PACU Anesthesia Type: General Level of consciousness: awake and alert Pain management: pain level controlled Vital Signs Assessment: post-procedure vital signs reviewed and stable Respiratory status: spontaneous breathing, nonlabored ventilation, respiratory function stable and patient connected to nasal cannula oxygen Cardiovascular status: blood pressure returned to baseline and stable Postop Assessment: no apparent nausea or vomiting Anesthetic complications: no    Last Vitals:  Vitals:   05/11/19 1200 05/11/19 1215  BP: (!) 166/126 (!) 150/80  Pulse: 87 76  Resp: 13 10  Temp:    SpO2: 100% 98%    Last Pain:  Vitals:   05/11/19 1215  TempSrc:   PainSc: Asleep                 Stefanos Haynesworth L Keaghan Bowens

## 2019-05-11 NOTE — Anesthesia Preprocedure Evaluation (Addendum)
Anesthesia Evaluation  Patient identified by MRN, date of birth, ID band Patient awake    Reviewed: Allergy & Precautions, NPO status , Patient's Chart, lab work & pertinent test results  Airway Mallampati: III  TM Distance: >3 FB Neck ROM: Full  Mouth opening: Limited Mouth Opening  Dental no notable dental hx. (+) Chipped, Dental Advisory Given, Missing,    Pulmonary neg pulmonary ROS, former smoker,    Pulmonary exam normal breath sounds clear to auscultation       Cardiovascular hypertension, Pt. on medications Normal cardiovascular exam+ dysrhythmias (s/p ablation 10 years ago) Atrial Fibrillation  Rhythm:Regular Rate:Normal  HLD   Neuro/Psych PSYCHIATRIC DISORDERS Anxiety Depression Cerebral palsy c/b right sided motor weakness negative neurological ROS     GI/Hepatic Neg liver ROS, GERD  Medicated and Controlled,  Endo/Other  diabetes (A1C 7.9), Oral Hypoglycemic Agents  Renal/GU negative Renal ROS  negative genitourinary   Musculoskeletal  (+) Arthritis , Osteoarthritis,    Abdominal   Peds  Hematology negative hematology ROS (+)   Anesthesia Other Findings   Reproductive/Obstetrics                            Anesthesia Physical Anesthesia Plan  ASA: III  Anesthesia Plan: General   Post-op Pain Management:    Induction: Intravenous  PONV Risk Score and Plan: 2 and Ondansetron, Dexamethasone and Midazolam  Airway Management Planned: LMA  Additional Equipment:   Intra-op Plan:   Post-operative Plan: Extubation in OR  Informed Consent: I have reviewed the patients History and Physical, chart, labs and discussed the procedure including the risks, benefits and alternatives for the proposed anesthesia with the patient or authorized representative who has indicated his/her understanding and acceptance.     Dental advisory given  Plan Discussed with: CRNA  Anesthesia  Plan Comments:         Anesthesia Quick Evaluation

## 2019-05-11 NOTE — Interval H&P Note (Signed)
History and Physical Interval Note:  05/11/2019 7:49 AM  Bradley Ballard  has presented today for surgery, with the diagnosis of BENIGN PROSTATIC HYPERPLASIA.  The various methods of treatment have been discussed with the patient and family. After consideration of risks, benefits and other options for treatment, the patient has consented to  Procedure(s) with comments: TRANSURETHRAL RESECTION OF THE PROSTATE (TURP) (N/A) - 2 HRS as a surgical intervention.  The patient's history has been reviewed, patient examined, no change in status, stable for surgery.  I have reviewed the patient's chart and labs.  Questions were answered to the patient's satisfaction.     Lashawnta Burgert D Gertha Lichtenberg

## 2019-05-11 NOTE — Discharge Instructions (Signed)

## 2019-05-11 NOTE — Anesthesia Procedure Notes (Signed)
Procedure Name: LMA Insertion Date/Time: 05/11/2019 9:29 AM Performed by: Garth Bigness, CRNA Pre-anesthesia Checklist: Patient identified, Emergency Drugs available, Suction available, Patient being monitored and Timeout performed Patient Re-evaluated:Patient Re-evaluated prior to induction Oxygen Delivery Method: Circle system utilized Preoxygenation: Pre-oxygenation with 100% oxygen Induction Type: IV induction Ventilation: Mask ventilation without difficulty LMA: LMA inserted LMA Size: 4.0 Number of attempts: 1 Placement Confirmation: positive ETCO2 Tube secured with: Tape Dental Injury: Teeth and Oropharynx as per pre-operative assessment

## 2019-05-11 NOTE — Op Note (Signed)
Preoperative diagnosis: 1. Bladder outlet obstruction secondary to BPH  Postoperative diagnosis:  1. Bladder outlet obstruction secondary to BPH  Procedure:  1. Cystoscopy 2. Transurethral resection of the prostate  Surgeon: Kasandra Knudsen,  M.D.  Anesthesia: General  Complications: None  EBL: Minimal  Specimens: 1. TURP chips  Indication: Bradley Ballard is a patient with bladder outlet obstruction secondary to benign prostatic hyperplasia. After reviewing the management options for treatment, he elected to proceed with the above surgical procedure(s). We have discussed the potential benefits and risks of the procedure, side effects of the proposed treatment, the likelihood of the patient achieving the goals of the procedure, and any potential problems that might occur during the procedure or recuperation. Informed consent has been obtained.  Description of procedure:  The patient was taken to the operating room and general anesthesia was induced.  The patient was placed in the dorsal lithotomy position, prepped and draped in the usual sterile fashion, and preoperative antibiotics were administered. A preoperative time-out was performed.   Cystourethroscopy was performed.  The patient's urethra was examined and  demonstrated bilobar prostatic hypertrophy with a median lobe.   The bladder was then systematically examined in its entirety. There was no evidence of any bladder tumors, stones, or other mucosal pathology.  The ureteral orifices were identified so as to be avoided during the procedure.  The prostate adenoma was then resected utilizing loop cautery resection with the bipolar cutting loop.  The prostate adenoma from the bladder neck back to the verumontanum was resected beginning at the six o'clock position and then extended to include the right and left lobes of the prostate and anterior prostate. Care was taken not to resect distal to the verumontanum.   Hemostasis was  then achieved with the cautery and the bladder was emptied and reinspected with no significant bleeding noted at the end of the procedure.  Bilateral ureteral orifices were visualized at the end of the case.   A 22Fr 3- way catheter was then placed into the bladder and in the inflow port was capped.  The patient appeared to tolerate the procedure well and without complications.  The patient was able to be awakened and transferred to the recovery unit in satisfactory condition.

## 2019-05-11 NOTE — Transfer of Care (Signed)
Immediate Anesthesia Transfer of Care Note  Patient: Bradley Ballard  Procedure(s) Performed: TRANSURETHRAL RESECTION OF THE PROSTATE (TURP) (N/A )  Patient Location: PACU  Anesthesia Type:General  Level of Consciousness: awake, oriented and patient cooperative  Airway & Oxygen Therapy: Patient Spontanous Breathing and Patient connected to face mask oxygen  Post-op Assessment: Report given to RN and Post -op Vital signs reviewed and stable  Post vital signs: Reviewed and stable  Last Vitals:  Vitals Value Taken Time  BP 161/98 05/11/19 1115  Temp    Pulse 98 05/11/19 1116  Resp 15 05/11/19 1116  SpO2 100 % 05/11/19 1116  Vitals shown include unvalidated device data.  Last Pain:  Vitals:   05/11/19 0710  TempSrc: Oral  PainSc: 1       Patients Stated Pain Goal: 4 (05/11/19 0710)  Complications: No apparent anesthesia complications

## 2019-05-12 LAB — URINE CULTURE: Culture: NO GROWTH

## 2019-05-13 LAB — SURGICAL PATHOLOGY

## 2020-01-13 LAB — IFOBT (OCCULT BLOOD): IFOBT: NEGATIVE

## 2020-02-04 ENCOUNTER — Telehealth: Payer: Self-pay | Admitting: Radiology

## 2020-02-04 ENCOUNTER — Encounter: Payer: Self-pay | Admitting: Internal Medicine

## 2020-02-04 ENCOUNTER — Other Ambulatory Visit: Payer: Self-pay

## 2020-02-04 ENCOUNTER — Ambulatory Visit: Payer: Medicare Other | Admitting: Internal Medicine

## 2020-02-04 ENCOUNTER — Encounter: Payer: Self-pay | Admitting: *Deleted

## 2020-02-04 VITALS — BP 130/70 | HR 88 | Ht 71.0 in | Wt 262.0 lb

## 2020-02-04 DIAGNOSIS — E1159 Type 2 diabetes mellitus with other circulatory complications: Secondary | ICD-10-CM

## 2020-02-04 DIAGNOSIS — E1169 Type 2 diabetes mellitus with other specified complication: Secondary | ICD-10-CM | POA: Diagnosis not present

## 2020-02-04 DIAGNOSIS — R0600 Dyspnea, unspecified: Secondary | ICD-10-CM | POA: Diagnosis not present

## 2020-02-04 DIAGNOSIS — E782 Mixed hyperlipidemia: Secondary | ICD-10-CM | POA: Insufficient documentation

## 2020-02-04 DIAGNOSIS — R0609 Other forms of dyspnea: Secondary | ICD-10-CM | POA: Insufficient documentation

## 2020-02-04 DIAGNOSIS — I152 Hypertension secondary to endocrine disorders: Secondary | ICD-10-CM

## 2020-02-04 DIAGNOSIS — I480 Paroxysmal atrial fibrillation: Secondary | ICD-10-CM | POA: Insufficient documentation

## 2020-02-04 DIAGNOSIS — E669 Obesity, unspecified: Secondary | ICD-10-CM

## 2020-02-04 LAB — PRO B NATRIURETIC PEPTIDE: NT-Pro BNP: 49 pg/mL (ref 0–376)

## 2020-02-04 NOTE — Progress Notes (Signed)
Cardiology Office Note:    Date:  02/04/2020   ID:  Bradley Ballard, DOB 16-Jul-1950, MRN [id number]  PCP:  Marton Redwood, MD  [facility] Cardiologist:  Werner Lean, MD  [facility] Electrophysiologist:  None   CC: DOE Consulted for the evaluation of Dyspnea on Exertion at the behest of Marton Redwood, MD  History of Present Illness:    Bradley Ballard is a 70 y.o. male with a hx of Atrial Fibrillation s/ Ablation in [place] ~ 2010, Morbid Obesity DM with HTN, HLD, OSA, Chart Review history of WPW.  Patient notes that he is feeling shortness of breath with exertion.  Has been losing strength in this last year.  Also notes that he can hardly his dogs on some day.  This comes and goes.  Some days he has no issues with exertion.  No palpitations.  Notes that he has been diagnosed with sleep apnea in the past, but has not had CPAP (tried to do weight loss).  Does note chest pressure with exertion when he has these issues.  Discomfort occurs with walking his dog on some days only, worsens with exertion or exercise after rest, and improves with rest.  Patient exertion notable for walking his down and feels these symptoms occasional.  No shortness of breath at rest.  No PND or orthopnea.  Notes some bendopnea, notes weight loss that is intentional, leg swelling in his right leg that is chronic in nature, but no abdominal swelling.  No syncope or near syncope.  Notes no palpitations or funny heart beats.     Patient reports prior cardiac testing including, OSH normal  heart catheterizations in 2010 prior cardioversion and then ablation in 2010.  Ambulatory SBP 130.   Past Medical History:  Diagnosis Date  . Anxiety   . Arthritis   . BPH (benign prostatic hyperplasia)   . Cerebral palsy (HCC)    Right Leg Issues   . Depression   . Diabetes mellitus without complication ([place])   . Dyspnea    exertion  . Dysrhythmia 2010  . GERD (gastroesophageal reflux disease)    Past -  not often   . H/O Bell's palsy   . History of atrial fibrillation    s/p ablation   . Hyperlipidemia   . Hypertension   . Neuromuscular disorder ([place])    peripheral neuropathy     Past Surgical History:  Procedure Laterality Date  . APPENDECTOMY     age 50  . ATRIAL FIBRILLATION ABLATION    . CARDIAC CATHETERIZATION  2013  . COLONOSCOPY     ~15 yrs ago   . POLYPECTOMY     ~15 yrs ago per pt had polyps x 3   . TRANSURETHRAL RESECTION OF PROSTATE N/A 05/11/2019   Procedure: TRANSURETHRAL RESECTION OF THE PROSTATE (TURP);  Surgeon: Robley Fries, MD;  Location: [facility];  Service: Urology;  Laterality: N/A;  2 HRS  . UPPER GASTROINTESTINAL ENDOSCOPY      Current Medications: Current Meds  Medication Sig  . aspirin 81 MG chewable tablet Chew 81 mg by mouth daily.   Marland Kitchen atorvastatin (LIPITOR) 10 MG tablet Take 10 mg by mouth at bedtime.  Marland Kitchen buPROPion (WELLBUTRIN XL) 300 MG 24 hr tablet Take 300 mg by mouth daily.   . DULoxetine (CYMBALTA) 60 MG capsule Take 60 mg by mouth at bedtime.  . fenofibrate 54 MG tablet Take 54 mg by mouth daily.   . finasteride (PROSCAR) 5  MG tablet Take 5 mg by mouth daily.   . fluticasone (FLONASE) 50 MCG/ACT nasal spray Place 1-2 sprays into both nostrils daily as needed for allergies or rhinitis.  Marland Kitchen gabapentin (NEURONTIN) 300 MG capsule Take 300 mg by mouth 2 (two) times daily.   Marland Kitchen ibuprofen (ADVIL) 200 MG tablet Take 600-800 mg by mouth every 6 (six) hours as needed for moderate pain (Arthiritis).  . metFORMIN (GLUCOPHAGE-XR) 750 MG 24 hr tablet Take 750 mg by mouth at bedtime.  Marland Kitchen oxyCODONE-acetaminophen (PERCOCET) 5-325 MG tablet Take 1 tablet by mouth every 6 (six) hours as needed for severe pain.  Marland Kitchen PREVIDENT 5000 BOOSTER PLUS 1.1 % PSTE Place 1 application onto teeth in the morning and at bedtime.   . sildenafil (REVATIO) 20 MG tablet Take 60-100 mg by mouth as needed (ED).  . tamsulosin (FLOMAX) 0.4 MG CAPS capsule Take 0.8 mg by mouth at bedtime.   . THERATEARS 0.25 % SOLN Place 1 drop into both eyes 3 (three) times daily as needed (dry/irritated eyes.).     Allergies:   Patient has no known allergies.   Social History   Socioeconomic History  . Marital status: Married    Spouse name: Not on file  . Number of children: Not on file  . Years of education: Not on file  . Highest education level: Not on file  Occupational History  . Not on file  Tobacco Use  . Smoking status: Former Smoker    Quit date: 01/29/1992    Years since quitting: 28.0  . Smokeless tobacco: Never Used  Vaping Use  . Vaping Use: Never used  Substance and Sexual Activity  . Alcohol use: Yes    Comment: very seldom- 1 every 3 months  . Drug use: Never  . Sexual activity: Not on file  Other Topics Concern  . Not on file  Social History Narrative  . Not on file   Social Determinants of Health   Financial Resource Strain: Not on file  Food Insecurity: Not on file  Transportation Needs: Not on file  Physical Activity: Not on file  Stress: Not on file  Social Connections: Not on file     Family History: The patient's family history includes Cancer in his mother. There is no history of Colon cancer, Colon polyps, Esophageal cancer, Rectal cancer, or Stomach cancer.  Father died in his sleep at age 72 Grandmother died of heart attack (both).  ROS:   Please see the history of present illness.    All other systems reviewed and are negative.  EKGs/Labs/Other Studies Reviewed:    The following studies were reviewed today:  EKG:   02/04/20: SR rate 88 RBBB 05/04/19: SR 76 RBBB  Recent Labs: 05/04/2019: BUN 28; Creatinine, Ser 0.93; Hemoglobin 13.5; Platelets 300; Potassium 4.3; Sodium 140  Recent Lipid Panel No results found for: CHOL, TRIG, HDL, CHOLHDL, VLDL, LDLCALC, LDLDIRECT   OSH Lipids 01/07/20 Cholesterol 119 TGs 96 HDL 41 LDL 59 Creatinine 1.1 TSH 2.9   Risk Assessment/Calculations:     CHA2DS2-VASc Score = 3  This indicates a  3.2% annual risk of stroke. The patient's score is based upon: CHF History: No HTN History: Yes Diabetes History: Yes Stroke History: No Vascular Disease History: No Age Score: 1 Gender Score: 0     Physical Exam:    VS:  BP 130/70   Pulse 88   Ht 5\' 11"  (1.803 m)   Wt 262 lb (118.8 kg)   SpO2 98%  BMI 36.54 kg/m     Wt Readings from Last 3 Encounters:  02/04/20 262 lb (118.8 kg)  05/11/19 275 lb 2 oz (124.8 kg)  05/04/19 275 lb 2 oz (124.8 kg)     IRW:ERXVQ, well developed in no acute distress HEENT: Normal NECK: No JVD; No carotid bruits LYMPHATICS: No lymphadenopathy CARDIAC: RRR, no murmurs, rubs, gallops RESPIRATORY:  Clear to auscultation without rales, wheezing or rhonchi  ABDOMEN: Soft, non-tender, non-distended MUSCULOSKELETAL:  Non-pitting edema R> L; No deformity  SKIN: Warm and dry NEUROLOGIC:  Alert and oriented x 3 PSYCHIATRIC:  Normal affect   ASSESSMENT:    1. DOE (dyspnea on exertion)   2. PAF (paroxysmal atrial fibrillation) (HCC)   3. Obesity, diabetes, and hypertension syndrome (HCC)   4. Mixed hyperlipidemia    PLAN:    In order of problems listed above:  DOE - The patient presents with possibly cardiac anginal equivalent - will check BNP - Would recommend pharmacological nuclear medicine stress test (NPO at midnight); discussed risks, benefits, and alternatives of the diagnostic procedure including chest pain, arrhythmia, and death.  Patient amenable for testing.  Paroxsymal Atrial Fibrillation - CHADSVASC=3. - give his prior ablation and new symptoms; will place 30 day [employer] Monitor  Morbid Obesity, Diabetes with hypertension OSA on CPAP - ambulatory blood pressure controlled, will continue ambulatory BP monitoring; gave education on how to perform ambulatory blood pressure monitoring including the frequency and technique; goal ambulatory blood pressure < 135/85 on average - continue home medications  - discussed diet  (DASH/low sodium), and exercise/weight loss interventions   Hyperlipidemia (familial) -LDL goal less than 100 -continue current statin - gave education on dietary changes  2 month follow up unless new symptoms or abnormal test results warranting change in plan  Would be reasonable for  APP Follow up   Shared Decision Making/Informed Consent The risks [chest pain, shortness of breath, cardiac arrhythmias, dizziness, blood pressure fluctuations, myocardial infarction, stroke/transient ischemic attack, nausea, vomiting, allergic reaction, radiation exposure, metallic taste sensation and life-threatening complications (estimated to be 1 in 10,000)], benefits (risk stratification, diagnosing coronary artery disease, treatment guidance) and alternatives of a nuclear stress test were discussed in detail with Mr. Obara and he agrees to proceed.       Medication Adjustments/Labs and Tests Ordered: Current medicines are reviewed at length with the patient today.  Concerns regarding medicines are outlined above.  Orders Placed This Encounter  Procedures  . Pro b natriuretic peptide  . MYOCARDIAL PERFUSION IMAGING  . CARDIAC EVENT MONITOR  . EKG 12-Lead   No orders of the defined types were placed in this encounter.   Patient Instructions  Medication Instructions:  Your physician recommends that you continue on your current medications as directed. Please refer to the Current Medication list given to you today.  *If you need a refill on your cardiac medications before your next appointment, please call your pharmacy*   Lab Work: Lab work to be done today--BNP If you have labs (blood work) drawn today and your tests are completely normal, you will receive your results only by: Marland Kitchen MyChart Message (if you have MyChart) OR . A paper copy in the mail If you have any lab test that is abnormal or we need to change your treatment, we will call you to review the  results.   Testing/Procedures: Your physician has recommended that you wear an event monitor. Event monitors are medical devices that record the heart's electrical activity. Doctors most often [place]  these monitors to diagnose arrhythmias. Arrhythmias are problems with the speed or rhythm of the heartbeat. The monitor is a small, portable device. You can wear one while you do your normal daily activities. This is usually used to diagnose what is causing palpitations/syncope (passing out).  Your physician has requested that you have a lexiscan myoview. For further information please visit [email or web address] Please follow instruction sheet, as given.     Follow-Up: At [facility], you and your health needs are our priority.  As part of our continuing mission to provide you with exceptional heart care, we have created designated Provider Care Teams.  These Care Teams include your primary Cardiologist (physician) and Advanced Practice Providers (APPs -  Physician Assistants and Nurse Practitioners) who all work together to provide you with the care you need, when you need it.  We recommend signing up for the patient portal called "MyChart".  Sign up information is provided on this After Visit Summary.  MyChart is used to connect with patients for Virtual Visits (Telemedicine).  Patients are able to view lab/test results, encounter notes, upcoming appointments, etc.  Non-urgent messages can be sent to your provider as well.   To learn more about what you can do with MyChart, go to [email or web address].    Your next appointment:   About 2 month(s)  The format for your next appointment:   In Person  Provider:   You may see Christell Constant, MD or one of the following Advanced Practice Providers on your designated Care Team:    Ronie Spies, PA-C  Jacolyn Reedy, PA-C    Other Instructions  [employer] Cardiac Event Monitor Instructions Your physician has requested you wear your  cardiac event monitor for ___30__ days, (1-30). [employer] may call or text to confirm a shipping address. The monitor will be sent to a land address via UPS. [employer] will not ship a monitor to a PO BOX. It typically takes 3-5 days to receive your monitor after it has been enrolled. [employer] will assist with USPS tracking if your package is delayed. The telephone number for [employer] is [phone]. Once you have received your monitor, please review the enclosed instructions. Instruction tutorials can also be viewed under help and settings on the enclosed cell phone. Your monitor has already been registered assigning a specific monitor serial # to you.  Applying the monitor Remove cell phone from case and turn it on. The cell phone works as IT consultant and needs to be within [employer] of you at all times. The cell phone will need to be charged on a daily basis. We recommend you plug the cell phone into the enclosed charger at your bedside table every night.  Monitor batteries: You will receive two monitor batteries labelled #1 and #2. These are your recorders. Plug battery #2 onto the second connection on the enclosed charger. Keep one battery on the charger at all times. This will keep the monitor battery deactivated. It will also keep it fully charged for when you need to switch your monitor batteries. A small light will be blinking on the battery emblem when it is charging. The light on the battery emblem will remain on when the battery is fully charged.  Open package of a Monitor strip. Insert battery #1 into black hood on strip and gently squeeze monitor battery onto connection as indicated in instruction booklet. Set aside while preparing skin.  Choose location for your strip, vertical or horizontal, as indicated in the instruction booklet. Shave to  remove all hair from location. There cannot be any lotions, oils, powders, or colognes on skin where monitor is to be applied.  Wipe skin clean with enclosed Saline wipe. Dry skin completely.  Peel paper labeled #1 off the back of the Monitor strip exposing the adhesive. Place the monitor on the chest in the vertical or horizontal position shown in the instruction booklet. One arrow on the monitor strip must be pointing upward. Carefully remove paper labeled #2, attaching remainder of strip to your skin. Try not to create any folds or wrinkles in the strip as you apply it.  Firmly press and release the circle in the center of the monitor battery. You will hear a small beep. This is turning the monitor battery on. The heart emblem on the monitor battery will light up every 5 seconds if the monitor battery in turned on and connected to the patient securely. Do not push and hold the circle down as this turns the monitor battery off. The cell phone will locate the monitor battery. A screen will appear on the cell phone checking the connection of your monitor strip. This may read poor connection initially but change to good connection within the next minute. Once your monitor accepts the connection you will hear a series of 3 beeps followed by a climbing crescendo of beeps. A screen will appear on the cell phone showing the two monitor strip placement options. Touch the picture that demonstrates where you applied the monitor strip.  Your monitor strip and battery are waterproof. You are able to shower, bathe, or swim with the monitor on. They just ask you do not submerge deeper than 3 feet underwater. We recommend removing the monitor if you are swimming in a lake, river, or ocean.  Your monitor battery will need to be switched to a fully charged monitor battery approximately once a week. The cell phone will alert you of an action which needs to be made.  On the cell phone, tap for details to reveal connection status, monitor battery status, and cell phone battery status. The green dots indicates your monitor is in good  status. A red dot indicates there is something that needs your attention.  To record a symptom, click the circle on the monitor battery. In 30-60 seconds a list of symptoms will appear on the cell phone. Select your symptom and tap save. Your monitor will record a sustained or significant arrhythmia regardless of you clicking the button. Some patients do not feel the heart rhythm irregularities. [employer] will notify us of any serious or critical events.  Refer to instruction booklet for instructions on switching batteries, changing strips, the Do not disturb or Pause features, or any additional questions.  Call [employer] at [phone], to confirm your monitor is transmitting and record your baseline. They will answer any questions you may have regarding the monitor instructions at that time.  Returning the monitor to [employer] Place all equipment back into blue box. Peel off strip of paper to expose adhesive and close box securely. There is a prepaid UPS shipping label on this box. Drop in a UPS drop box, or at a UPS facility like [employer]. You may also contact [employer] to arrange UPS to pick up monitor package at your home.     Signed, Christell Constant, MD  02/04/2020 9:14 AM    [facility]

## 2020-02-04 NOTE — Telephone Encounter (Signed)
Enrolled patient for a 30 day [employer] Event Monitor to be mailed to patients home  

## 2020-02-04 NOTE — Patient Instructions (Addendum)
Medication Instructions:  Your physician recommends that you continue on your current medications as directed. Please refer to the Current Medication list given to you today.  *If you need a refill on your cardiac medications before your next appointment, please call your pharmacy*   Lab Work: Lab work to be done today--BNP If you have labs (blood work) drawn today and your tests are completely normal, you will receive your results only by: Marland Kitchen MyChart Message (if you have MyChart) OR . A paper copy in the mail If you have any lab test that is abnormal or we need to change your treatment, we will call you to review the results.   Testing/Procedures: Your physician has recommended that you wear an event monitor. Event monitors are medical devices that record the heart's electrical activity. Doctors most often [place] these monitors to diagnose arrhythmias. Arrhythmias are problems with the speed or rhythm of the heartbeat. The monitor is a small, portable device. You can wear one while you do your normal daily activities. This is usually used to diagnose what is causing palpitations/syncope (passing out).  Your physician has requested that you have a lexiscan myoview. For further information please visit [email or web address] Please follow instruction sheet, as given.     Follow-Up: At [facility], you and your health needs are our priority.  As part of our continuing mission to provide you with exceptional heart care, we have created designated Provider Care Teams.  These Care Teams include your primary Cardiologist (physician) and Advanced Practice Providers (APPs -  Physician Assistants and Nurse Practitioners) who all work together to provide you with the care you need, when you need it.  We recommend signing up for the patient portal called "MyChart".  Sign up information is provided on this After Visit Summary.  MyChart is used to connect with patients for Virtual Visits (Telemedicine).   Patients are able to view lab/test results, encounter notes, upcoming appointments, etc.  Non-urgent messages can be sent to your provider as well.   To learn more about what you can do with MyChart, go to [email or web address].    Your next appointment:   About 2 month(s)  The format for your next appointment:   In Person  Provider:   You may see Christell Constant, MD or one of the following Advanced Practice Providers on your designated Care Team:    Ronie Spies, PA-C  Jacolyn Reedy, PA-C    Other Instructions  [employer] Cardiac Event Monitor Instructions Your physician has requested you wear your cardiac event monitor for ___30__ days, (1-30). [employer] may call or text to confirm a shipping address. The monitor will be sent to a land address via UPS. [employer] will not ship a monitor to a PO BOX. It typically takes 3-5 days to receive your monitor after it has been enrolled. [employer] will assist with USPS tracking if your package is delayed. The telephone number for [employer] is [phone]. Once you have received your monitor, please review the enclosed instructions. Instruction tutorials can also be viewed under help and settings on the enclosed cell phone. Your monitor has already been registered assigning a specific monitor serial # to you.  Applying the monitor Remove cell phone from case and turn it on. The cell phone works as IT consultant and needs to be within [employer] of you at all times. The cell phone will need to be charged on a daily basis. We recommend you plug the cell phone into the enclosed charger at your  bedside table every night.  Monitor batteries: You will receive two monitor batteries labelled #1 and #2. These are your recorders. Plug battery #2 onto the second connection on the enclosed charger. Keep one battery on the charger at all times. This will keep the monitor battery deactivated. It will also keep it fully charged for when you  need to switch your monitor batteries. A small light will be blinking on the battery emblem when it is charging. The light on the battery emblem will remain on when the battery is fully charged.  Open package of a Monitor strip. Insert battery #1 into black hood on strip and gently squeeze monitor battery onto connection as indicated in instruction booklet. Set aside while preparing skin.  Choose location for your strip, vertical or horizontal, as indicated in the instruction booklet. Shave to remove all hair from location. There cannot be any lotions, oils, powders, or colognes on skin where monitor is to be applied. Wipe skin clean with enclosed Saline wipe. Dry skin completely.  Peel paper labeled #1 off the back of the Monitor strip exposing the adhesive. Place the monitor on the chest in the vertical or horizontal position shown in the instruction booklet. One arrow on the monitor strip must be pointing upward. Carefully remove paper labeled #2, attaching remainder of strip to your skin. Try not to create any folds or wrinkles in the strip as you apply it.  Firmly press and release the circle in the center of the monitor battery. You will hear a small beep. This is turning the monitor battery on. The heart emblem on the monitor battery will light up every 5 seconds if the monitor battery in turned on and connected to the patient securely. Do not push and hold the circle down as this turns the monitor battery off. The cell phone will locate the monitor battery. A screen will appear on the cell phone checking the connection of your monitor strip. This may read poor connection initially but change to good connection within the next minute. Once your monitor accepts the connection you will hear a series of 3 beeps followed by a climbing crescendo of beeps. A screen will appear on the cell phone showing the two monitor strip placement options. Touch the picture that demonstrates where you  applied the monitor strip.  Your monitor strip and battery are waterproof. You are able to shower, bathe, or swim with the monitor on. They just ask you do not submerge deeper than 3 feet underwater. We recommend removing the monitor if you are swimming in a lake, river, or ocean.  Your monitor battery will need to be switched to a fully charged monitor battery approximately once a week. The cell phone will alert you of an action which needs to be made.  On the cell phone, tap for details to reveal connection status, monitor battery status, and cell phone battery status. The green dots indicates your monitor is in good status. A red dot indicates there is something that needs your attention.  To record a symptom, click the circle on the monitor battery. In 30-60 seconds a list of symptoms will appear on the cell phone. Select your symptom and tap save. Your monitor will record a sustained or significant arrhythmia regardless of you clicking the button. Some patients do not feel the heart rhythm irregularities. [employer] will notify us of any serious or critical events.  Refer to instruction booklet for instructions on switching batteries, changing strips, the Do not disturb  or Pause features, or any additional questions.  Call [employer] at [phone], to confirm your monitor is transmitting and record your baseline. They will answer any questions you may have regarding the monitor instructions at that time.  Returning the monitor to [employer] Place all equipment back into blue box. Peel off strip of paper to expose adhesive and close box securely. There is a prepaid UPS shipping label on this box. Drop in a UPS drop box, or at a UPS facility like [employer]. You may also contact [employer] to arrange UPS to pick up monitor package at your home.

## 2020-02-08 ENCOUNTER — Ambulatory Visit: Payer: Medicare Other | Attending: Internal Medicine | Admitting: Physical Therapy

## 2020-02-08 ENCOUNTER — Other Ambulatory Visit: Payer: Self-pay

## 2020-02-08 ENCOUNTER — Encounter: Payer: Self-pay | Admitting: Physical Therapy

## 2020-02-08 DIAGNOSIS — R296 Repeated falls: Secondary | ICD-10-CM | POA: Diagnosis present

## 2020-02-08 DIAGNOSIS — M25651 Stiffness of right hip, not elsewhere classified: Secondary | ICD-10-CM | POA: Diagnosis present

## 2020-02-08 DIAGNOSIS — R293 Abnormal posture: Secondary | ICD-10-CM | POA: Insufficient documentation

## 2020-02-08 DIAGNOSIS — M6281 Muscle weakness (generalized): Secondary | ICD-10-CM | POA: Insufficient documentation

## 2020-02-08 DIAGNOSIS — M25551 Pain in right hip: Secondary | ICD-10-CM | POA: Diagnosis present

## 2020-02-08 NOTE — Therapy (Signed)
[facility] [address] [place], [place], [address] Phone: [phone]   Fax:  [phone]  Physical Therapy Evaluation  Patient Details  Name: Bradley Ballard MRN: [id number] Date of Birth: 06/15/1950 Referring Provider (PT): Dr. Martha ClanWilliam Shaw   Encounter Date: 02/08/2020   PT End of Session - 02/08/20 1141    Visit Number 1    Number of Visits 16    Date for PT Re-Evaluation 04/04/20    Authorization Type MCR    PT Start Time 0835    PT Stop Time 0925    PT Time Calculation (min) 50 min    Activity Tolerance Patient tolerated treatment well    Behavior During Therapy [facility] for tasks assessed/performed           Past Medical History:  Diagnosis Date  . Anxiety   . Arthritis   . BPH (benign prostatic hyperplasia)   . Cerebral palsy (HCC)    Right Leg Issues   . Depression   . Diabetes mellitus without complication (HCC)   . Dyspnea    exertion  . Dysrhythmia 2010  . GERD (gastroesophageal reflux disease)    Past - not often   . H/O Bell's palsy   . History of atrial fibrillation    s/p ablation   . Hyperlipidemia   . Hypertension   . Neuromuscular disorder (HCC)    peripheral neuropathy     Past Surgical History:  Procedure Laterality Date  . APPENDECTOMY     age 70  . ATRIAL FIBRILLATION ABLATION    . CARDIAC CATHETERIZATION  2013  . COLONOSCOPY     ~15 yrs ago   . POLYPECTOMY     ~15 yrs ago per pt had polyps x 3   . TRANSURETHRAL RESECTION OF PROSTATE N/A 05/11/2019   Procedure: TRANSURETHRAL RESECTION OF THE PROSTATE (TURP);  Surgeon: Noel ChristmasPace, Maryellen D, MD;  Location: [facility];  Service: Urology;  Laterality: N/A;  2 HRS  . UPPER GASTROINTESTINAL ENDOSCOPY      There were no vitals filed for this visit.    Subjective Assessment - 02/08/20 0840    Subjective I have lost alot of muscle mass this year.  Hard to lift things. I have fallen numerous times.  Sept I think I fell 30 times.  I have hit my  head and bang up my shoulders.  I have not fallen the past 2 weeks.  I drag my Rt foot from cerebral palsy. Plus I have Rt hip tightness.  I have a walking stick and a cane and a walker. Uses walking stick when in the community when on uneven ground.  Wore a brace as a child. Has pain in low back , Rt hip.    Pertinent History Cerebral palsy.  A fib, neuropathy, neck pain , shoulder    Limitations Standing;Walking;Lifting    How long can you stand comfortably? not ever    How long can you walk comfortably? not ever    Diagnostic tests none recent    Patient Stated Goals Pain relief and resume walking program , rebuild strength    Currently in Pain? Yes    Pain Score 3     Pain Location Leg    Pain Orientation Right;Posterior    Pain Descriptors / Indicators Tightness    Pain Type Chronic pain    Pain Radiating Towards usually stops at knee    Pain Onset More than a month ago    Pain  Frequency Constant    Aggravating Factors  standing, walking    Pain Relieving Factors laying down, has not stretched recently.    Effect of Pain on Daily Activities limits mobility              [facility] PT Assessment - 02/08/20 0001      Assessment   Medical Diagnosis repeated falls, Rt hip weakness    Referring Provider (PT) Dr. Martha Clan    Onset Date/Surgical Date --   chronic   Prior Therapy Long ago      Precautions   Precautions Fall      Restrictions   Weight Bearing Restrictions No      Balance Screen   Has the patient fallen in the past 6 months Yes    How many times? numerous    Has the patient had a decrease in activity level because of a fear of falling?  Yes    Is the patient reluctant to leave their home because of a fear of falling?  Yes      Home Environment   Living Environment Private residence    Living Arrangements Spouse/significant other    Type of Home Apartment    Home Access Stairs to enter    Home Layout One level    Home Equipment Walker - 2 wheels;Cane -  single point;Shower seat    Additional Comments has alot of uneven ground in his apt grounds      Prior Function   Level of Independence Independent with basic ADLs    Vocation Retired    [employer] was in Art gallery manager    Leisure dog, travels to [employer], hunting and fishing, camping      Cognition   Overall Cognitive Status Within Functional Limits for tasks assessed      Observation/Other Assessments   Focus on Therapeutic Outcomes (FOTO)  51% able      Sensation   Light Touch Appears Intact      Posture/Postural Control   Posture/Postural Control Postural limitations    Postural Limitations Rounded Shoulders;Forward head;Increased thoracic kyphosis;Weight shift left;Flexed trunk;Left pelvic obliquity    Posture Comments leans to L , wide BOS in LEs, hips externally rotated (R>L)      PROM   Overall PROM Comments Rt hip ER 45, Lt. hip 10 deg.  Lt hip ER 45 and IR 10-15 deg   no pain with ROM, felt good to patient     Strength   Right Hip Flexion 3+/5    Right Hip ABduction 3+/5    Left Hip Flexion 5/5    Left Hip ABduction 4-/5    Right Knee Flexion 5/5    Right Knee Extension 4/5    Left Knee Flexion 5/5    Left Knee Extension 5/5      Flexibility   Hamstrings very tight Rt 30 deg (SLR) and Lt 40 deg    Piriformis Rt >Lt. , also tight bilateral hip flexors , Rt Achilles      Palpation   Palpation comment min pain with palpation to Rt lateral hip, glute      Transfers   Five time sit to stand comments  15 sec , needs UE on thighs, leans far to the L due to Rt LE weakness      Ambulation/Gait   Ambulation Distance (Feet) 150 Feet    Assistive device None    Gait Pattern Decreased step length - right;Step-through pattern;Decreased stance time - right;Decreased  dorsiflexion - right;Decreased weight shift to right;Lateral hip instability;Antalgic;Lateral trunk lean to left;Trunk flexed;Wide base of support    Ambulation Surface Indoor             Objective measurements completed on examination: See above findings.      PT Education - 02/08/20 1140    Education Details PT, POC, HEP, alignment and tightness in Rt hip, LE , balance    Person(s) Educated Patient    Methods Explanation;Demonstration;Verbal cues;Handout    Comprehension Verbalized understanding;Returned demonstration;Need further instruction            PT Short Term Goals - 02/08/20 1142      PT SHORT TERM GOAL #1   Title Pt will be I with basic HEP for strength, mobility    Baseline given on eval    Time 4    Period Weeks    Status New    Target Date 03/07/20      PT SHORT TERM GOAL #2   Title FOTO results will be reviewed and info related to goals    Time 4    Period Weeks    Status New    Target Date 03/07/20      PT SHORT TERM GOAL #3   Title Balance screening will be completed and goal set for reducing fall risk.    Time 4    Period Weeks    Status New    Target Date 03/07/20             PT Long Term Goals - 02/08/20 1142      PT LONG TERM GOAL #1   Title FOTO score will improve to 59% able to more to demo improved functional mobility    Time 8    Period Weeks    Status New    Target Date 04/04/20      PT LONG TERM GOAL #2   Title balance goal TBA    Time 8    Period Weeks    Status New    Target Date 04/04/20      PT LONG TERM GOAL #3   Title Pt will be I with more advanced HEP for LE strength, balance    Time 8    Period Weeks    Status New    Target Date 04/04/20      PT LONG TERM GOAL #4   Title Patient will be able to walk dog using walking stick safely for 30 min at least 3 x per week.    Time 8    Period Weeks    Status New    Target Date 04/04/20      PT LONG TERM GOAL #5   Title Pt will perform floor transfer with Mod I for fall recovery    Time 8    Period Weeks    Status New    Target Date 04/04/20                  Plan - 02/08/20 1140    Clinical Impression Statement This patient  presents to PT for mod complexity eval of repeated falls and chronic Rt hip, back pain and weakness.  He used to be able to walk his dog for short periods without difficulty but has over the year lost strength and increased frequency of falls.  Contributing factors include Rt hip tightness, LE weakness and poor ankle control due to chronic lack of strength.  He needs supervision for safety with  turning, walking on uneven terrain when he does not have an assistive device. He also reports limitations in neck and shoulder function as well which we can address if MD allows.  He may also benefit from an ankle AFO to improve safety with gait in the community.    Personal Factors and Comorbidities Age;Behavior Pattern;Comorbidity 3+;Time since onset of injury/illness/exacerbation;Past/Current Experience    Comorbidities cerebral palsy with chronic tightness/tone, obesity, back pain, others/see snapshot    Examination-Activity Limitations Transfers;Bed Mobility;Bend;Lift;Squat;Stairs;Public relations account executive;Reach Overhead;Stand    Examination-Participation Restrictions Cleaning;Community Activity;Meal Prep;Shop    Stability/Clinical Decision Making Evolving/Moderate complexity    Clinical Decision Making Moderate    Rehab Potential Good    PT Frequency 2x / week    PT Duration 8 weeks    PT Treatment/Interventions ADLs/Self Care Home Management;Gait training;Therapeutic exercise;Patient/family education;Neuromuscular re-education;Balance training;Therapeutic activities;DME Instruction;Moist Heat;Traction;Functional mobility training;Stair training;Cryotherapy;Manual techniques    PT Next Visit Plan balance screening (TUG, Berg), check HEP , NusTep , monitor o2, HR?    PT Home Exercise Plan [id number]    Consulted and Agree with Plan of Care Patient           Patient will benefit from skilled therapeutic intervention in order to improve the following deficits and impairments:  Abnormal gait,Decreased  balance,Decreased endurance,Decreased mobility,Difficulty walking,Obesity,Decreased range of motion,Decreased activity tolerance,Decreased strength,Increased fascial restricitons,Impaired flexibility,Impaired UE functional use,Postural dysfunction,Pain  Visit Diagnosis: Repeated falls  Abnormal posture  Pain in right hip  Muscle weakness (generalized)  Stiffness of right hip, not elsewhere classified     Problem List Patient Active Problem List   Diagnosis Date Noted  . DOE (dyspnea on exertion) 02/04/2020  . PAF (paroxysmal atrial fibrillation) (HCC) 02/04/2020  . Obesity, diabetes, and hypertension syndrome (HCC) 02/04/2020  . Mixed hyperlipidemia 02/04/2020    Karianne Nogueira 02/08/2020, 11:59 AM  [facility] [address] [place], [place], [address] Phone: [phone]   Fax:  [phone]  Name: ELGIE MAZIARZ MRN: [id number] Date of Birth: 1950/02/09    Karie Mainland, PT 02/08/20 12:00 PM Phone: [phone] Fax: [phone]

## 2020-02-08 NOTE — Patient Instructions (Signed)
[id number]

## 2020-02-10 ENCOUNTER — Ambulatory Visit: Payer: Medicare Other | Admitting: Physical Therapy

## 2020-02-10 ENCOUNTER — Other Ambulatory Visit: Payer: Self-pay

## 2020-02-10 ENCOUNTER — Telehealth (HOSPITAL_COMMUNITY): Payer: Self-pay

## 2020-02-10 DIAGNOSIS — R296 Repeated falls: Secondary | ICD-10-CM

## 2020-02-10 DIAGNOSIS — R293 Abnormal posture: Secondary | ICD-10-CM

## 2020-02-10 DIAGNOSIS — M6281 Muscle weakness (generalized): Secondary | ICD-10-CM

## 2020-02-10 DIAGNOSIS — M25651 Stiffness of right hip, not elsewhere classified: Secondary | ICD-10-CM

## 2020-02-10 DIAGNOSIS — M25551 Pain in right hip: Secondary | ICD-10-CM

## 2020-02-10 NOTE — Therapy (Signed)
[facility] [address] [place], [place], [address] Phone: [phone]   Fax:  [phone]  Physical Therapy Treatment  Patient Details  Name: Bradley Ballard MRN: [id number] Date of Birth: 14-Apr-1950 Referring Provider (PT): Dr. Martha Clan   Encounter Date: 02/10/2020   PT End of Session - 02/10/20 1126    Visit Number 2    Number of Visits 16    Date for PT Re-Evaluation 04/04/20    Authorization Type MCR    PT Start Time 1025    PT Stop Time 1110    PT Time Calculation (min) 45 min    Activity Tolerance Patient tolerated treatment well    Behavior During Therapy [facility] for tasks assessed/performed           Past Medical History:  Diagnosis Date  . Anxiety   . Arthritis   . BPH (benign prostatic hyperplasia)   . Cerebral palsy (HCC)    Right Leg Issues   . Depression   . Diabetes mellitus without complication (HCC)   . Dyspnea    exertion  . Dysrhythmia 2010  . GERD (gastroesophageal reflux disease)    Past - not often   . H/O Bell's palsy   . History of atrial fibrillation    s/p ablation   . Hyperlipidemia   . Hypertension   . Neuromuscular disorder (HCC)    peripheral neuropathy     Past Surgical History:  Procedure Laterality Date  . APPENDECTOMY     age 72  . ATRIAL FIBRILLATION ABLATION    . CARDIAC CATHETERIZATION  2013  . COLONOSCOPY     ~15 yrs ago   . POLYPECTOMY     ~15 yrs ago per pt had polyps x 3   . TRANSURETHRAL RESECTION OF PROSTATE N/A 05/11/2019   Procedure: TRANSURETHRAL RESECTION OF THE PROSTATE (TURP);  Surgeon: Noel Christmas, MD;  Location: [facility];  Service: Urology;  Laterality: N/A;  2 HRS  . UPPER GASTROINTESTINAL ENDOSCOPY      There were no vitals filed for this visit.   Subjective Assessment - 02/10/20 1123    Subjective Patient reports he is doing well, still walking with right leg turned out and with hip weakness. No falls since last visit. He is starting to get  into his exercises. He walked his dog for 1 mile earlier today which is really good for him.    Patient Stated Goals Pain relief and resume walking program , rebuild strength    Currently in Pain? Yes    Pain Score 3     Pain Location Leg    Pain Orientation Right    Pain Descriptors / Indicators Tightness;Aching    Pain Type Chronic pain    Pain Onset More than a month ago    Pain Frequency Constant              [facility] PT Assessment - 02/10/20 0001      Standardized Balance Assessment   Standardized Balance Assessment Berg Balance Test;Timed Up and Go Test      Berg Balance Test   Sit to Stand Able to stand  independently using hands    Standing Unsupported Able to stand safely 2 minutes    Sitting with Back Unsupported but Feet Supported on Floor or Stool Able to sit safely and securely 2 minutes    Stand to Sit Controls descent by using hands    Transfers Able to transfer safely, minor use  of hands    Standing Unsupported with Eyes Closed Able to stand 10 seconds with supervision    Standing Unsupported with Feet Together Able to place feet together independently and stand for 1 minute with supervision    From Standing, Reach Forward with Outstretched Arm Can reach forward >12 cm safely (5")    From Standing Position, Pick up Object from Floor Able to pick up shoe safely and easily    From Standing Position, Turn to Look Behind Over each Shoulder Looks behind from both sides and weight shifts well    Turn 360 Degrees Able to turn 360 degrees safely one side only in 4 seconds or less    Standing Unsupported, Alternately Place Feet on Step/Stool Able to complete >2 steps/needs minimal assist    Standing Unsupported, One Foot in Front Able to take small step independently and hold 30 seconds    Standing on One Leg Tries to lift leg/unable to hold 3 seconds but remains standing independently    Total Score 42      Timed Up and Go Test   TUG Normal TUG    Normal TUG (seconds) 14                          [facility] Adult PT Treatment/Exercise - 02/10/20 0001      Self-Care   Self-Care Other Self-Care Comments    Other Self-Care Comments  Balance exam findings, using AD for ambulation especially in community      Exercises   Exercises Knee/Hip      Knee/Hip Exercises: Stretches   Passive Hamstring Stretch 2 reps;30 seconds    Passive Hamstring Stretch Limitations supine PROM    Hip Flexor Stretch 2 reps;30 seconds    Hip Flexor Stretch Limitations thomas PROM    Piriformis Stretch 2 reps;30 seconds    Piriformis Stretch Limitations supine PROM    Other Knee/Hip Stretches SKTC stretch 2 x 30 sec PROM    Other Knee/Hip Stretches LTR x 10; Hooklying figure-4 hip IR stretch x 30 sec      Knee/Hip Exercises: Supine   Bridges 10 reps   5 sec hold   Straight Leg Raises Limitations Trialed but patient unable to coordinate so d/c'd    Other Supine Knee/Hip Exercises Alternating march with green band x 20    Other Supine Knee/Hip Exercises Trialed hooklying clam with green unilateral x 10 each      Knee/Hip Exercises: Sidelying   Hip ABduction 10 reps    Hip ABduction Limitations cued to avoid rolling backward    Clams Trialed clam but patient had difficulty coordinating movement so d/c'd                  PT Education - 02/10/20 1109    Education Details HEP update, exam findings and balance deficits    Person(s) Educated Patient    Methods Explanation;Demonstration;Tactile cues;Verbal cues;Handout    Comprehension Verbalized understanding;Returned demonstration;Verbal cues required;Tactile cues required;Need further instruction            PT Short Term Goals - 02/08/20 1142      PT SHORT TERM GOAL #1   Title Pt will be I with basic HEP for strength, mobility    Baseline given on eval    Time 4    Period Weeks    Status New    Target Date 03/07/20      PT SHORT TERM GOAL #2  Title FOTO results will be reviewed and info related to  goals    Time 4    Period Weeks    Status New    Target Date 03/07/20      PT SHORT TERM GOAL #3   Title Balance screening will be completed and goal set for reducing fall risk.    Time 4    Period Weeks    Status New    Target Date 03/07/20             PT Long Term Goals - 02/08/20 1142      PT LONG TERM GOAL #1   Title FOTO score will improve to 59% able to more to demo improved functional mobility    Time 8    Period Weeks    Status New    Target Date 04/04/20      PT LONG TERM GOAL #2   Title balance goal TBA    Time 8    Period Weeks    Status New    Target Date 04/04/20      PT LONG TERM GOAL #3   Title Pt will be I with more advanced HEP for LE strength, balance    Time 8    Period Weeks    Status New    Target Date 04/04/20      PT LONG TERM GOAL #4   Title Patient will be able to walk dog using walking stick safely for 30 min at least 3 x per week.    Time 8    Period Weeks    Status New    Target Date 04/04/20      PT LONG TERM GOAL #5   Title Pt will perform floor transfer with Mod I for fall recovery    Time 8    Period Weeks    Status New    Target Date 04/04/20                 Plan - 02/10/20 1126    Clinical Impression Statement Patient tolerated therapy well with no adverse effects. He demonstrates signfinicant balance deficits with high fall risk based on TUG and BERG this visit. Discussed using AD with patient due to balance and fall risk with frequent falls. Therapy continued focus on improving flexibility and progress hip strength. Updated HEP with good tolerance. Patient did report fatigue following therapy. He would benefit from continued skilled PT to progress mobility, strength, and balance to reduce fall risk and maximize functional ability.    PT Treatment/Interventions ADLs/Self Care Home Management;Gait training;Therapeutic exercise;Patient/family education;Neuromuscular re-education;Balance training;Therapeutic  activities;DME Instruction;Moist Heat;Traction;Functional mobility training;Stair training;Cryotherapy;Manual techniques    PT Next Visit Plan Review HEP and progress PRN, NuStep, progress hip/general LE strengthening, initiate balance training    PT Home Exercise Plan [email or web address]    Consulted and Agree with Plan of Care Patient           Patient will benefit from skilled therapeutic intervention in order to improve the following deficits and impairments:  Abnormal gait,Decreased balance,Decreased endurance,Decreased mobility,Difficulty walking,Obesity,Decreased range of motion,Decreased activity tolerance,Decreased strength,Increased fascial restricitons,Impaired flexibility,Impaired UE functional use,Postural dysfunction,Pain  Visit Diagnosis: Repeated falls  Abnormal posture  Pain in right hip  Muscle weakness (generalized)  Stiffness of right hip, not elsewhere classified     Problem List Patient Active Problem List   Diagnosis Date Noted  . DOE (dyspnea on exertion) 02/04/2020  . PAF (paroxysmal atrial fibrillation) (HCC) 02/04/2020  . Obesity, diabetes,  and hypertension syndrome (HCC) 02/04/2020  . Mixed hyperlipidemia 02/04/2020    Rosana Hoes, PT, DPT, LAT, ATC 02/10/20  11:45 AM Phone: [phone] Fax: [phone]   [facility] Outpatient Rehabilitation [facility] [address] [place], [place], [address] Phone: [phone]   Fax:  [phone]  Name: Bradley Ballard MRN: [id number] Date of Birth: 1950-11-17

## 2020-02-10 NOTE — Patient Instructions (Signed)
  Begin lying on your back with knees bent and feet flat, put yout left ankle on right knee, pull right knee in towards the middle  Repetitions: 2-3 Hold: 30-60 seconds    Access Code: [email or web address] URL: [email or web address] Date: 02/10/2020 Prepared by: Rosana Hoes  Exercises Supine Lower Trunk Rotation - 2 x daily - 7 x weekly - 2 sets - 10 reps - 10 hold Supine Hamstring Stretch with Strap - 2 x daily - 7 x weekly - 2 sets - 3 reps - 30 hold Modified Thomas Stretch - 1 x daily - 7 x weekly - 1 sets - 3 reps - 60 hold Supine Bridge - 2 x daily - 7 x weekly - 2 sets - 10 reps - 5 hold Sidelying Hip Abduction - 2 x daily - 7 x weekly - 2 sets - 10 reps Supine March with Resistance Band - 2 x daily - 7 x weekly - 2 sets - 10 reps

## 2020-02-10 NOTE — Telephone Encounter (Signed)
Spoke with the patient, detailed instructions given. He stated that he would be here for his test. Asked to call back with any questions. Bradley Ballard EMTP 

## 2020-02-13 ENCOUNTER — Ambulatory Visit (INDEPENDENT_AMBULATORY_CARE_PROVIDER_SITE_OTHER): Payer: Medicare Other

## 2020-02-13 DIAGNOSIS — I480 Paroxysmal atrial fibrillation: Secondary | ICD-10-CM

## 2020-02-15 ENCOUNTER — Encounter (HOSPITAL_COMMUNITY): Payer: Medicare Other

## 2020-02-16 ENCOUNTER — Ambulatory Visit: Payer: Medicare Other | Admitting: Physical Therapy

## 2020-02-16 ENCOUNTER — Other Ambulatory Visit: Payer: Self-pay

## 2020-02-16 ENCOUNTER — Encounter: Payer: Self-pay | Admitting: Physical Therapy

## 2020-02-16 DIAGNOSIS — M25551 Pain in right hip: Secondary | ICD-10-CM

## 2020-02-16 DIAGNOSIS — M6281 Muscle weakness (generalized): Secondary | ICD-10-CM

## 2020-02-16 DIAGNOSIS — M25651 Stiffness of right hip, not elsewhere classified: Secondary | ICD-10-CM

## 2020-02-16 DIAGNOSIS — R296 Repeated falls: Secondary | ICD-10-CM | POA: Diagnosis not present

## 2020-02-16 DIAGNOSIS — R293 Abnormal posture: Secondary | ICD-10-CM

## 2020-02-16 NOTE — Therapy (Signed)
[facility] Outpatient Rehabilitation [facility] [address] [place], [place], [address] Phone: [phone]   Fax:  [phone]  Physical Therapy Treatment  Patient Details  Name: MOHMED FARVER MRN: [id number] Date of Birth: 1950/06/10 Referring Provider (PT): Dr. Martha Clan   Encounter Date: 02/16/2020   PT End of Session - 02/16/20 1548    Visit Number 3    Number of Visits 16    Date for PT Re-Evaluation 04/04/20    Authorization Type MCR    PT Start Time 1535    PT Stop Time 1619    PT Time Calculation (min) 44 min    Activity Tolerance Patient tolerated treatment well    Behavior During Therapy [facility] for tasks assessed/performed           Past Medical History:  Diagnosis Date  . Anxiety   . Arthritis   . BPH (benign prostatic hyperplasia)   . Cerebral palsy (HCC)    Right Leg Issues   . Depression   . Diabetes mellitus without complication (HCC)   . Dyspnea    exertion  . Dysrhythmia 2010  . GERD (gastroesophageal reflux disease)    Past - not often   . H/O Bell's palsy   . History of atrial fibrillation    s/p ablation   . Hyperlipidemia   . Hypertension   . Neuromuscular disorder (HCC)    peripheral neuropathy     Past Surgical History:  Procedure Laterality Date  . APPENDECTOMY     age 70  . ATRIAL FIBRILLATION ABLATION    . CARDIAC CATHETERIZATION  2013  . COLONOSCOPY     ~15 yrs ago   . POLYPECTOMY     ~15 yrs ago per pt had polyps x 3   . TRANSURETHRAL RESECTION OF PROSTATE N/A 05/11/2019   Procedure: TRANSURETHRAL RESECTION OF THE PROSTATE (TURP);  Surgeon: Noel Christmas, MD;  Location: [facility];  Service: Urology;  Laterality: N/A;  2 HRS  . UPPER GASTROINTESTINAL ENDOSCOPY      There were no vitals filed for this visit.   Subjective Assessment - 02/16/20 1544    Subjective My back was really bothering me earlier.  I lifted a laundry basket earlier.  I have not done my stretches today but I have been busy. Wearing a  heart monitor for awhile .    Currently in Pain? Yes    Pain Score 7     Pain Location Hip    Pain Orientation Right;Posterior                [facility] Adult PT Treatment/Exercise - 02/16/20 0001      Knee/Hip Exercises: Stretches   Passive Hamstring Stretch 30 seconds;3 reps;Right    Hip Flexor Stretch Right;3 reps;30 seconds    Piriformis Stretch 2 reps;30 seconds    Piriformis Stretch Limitations knees crossed with rotation    Other Knee/Hip Stretches LTR x 10; Hooklying figure-4 hip IR stretch x 30 sec      Knee/Hip Exercises: Aerobic   Nustep LE and UE for 6 min L 6      Knee/Hip Exercises: Standing   Hip Abduction Both;10 reps;2 sets      Knee/Hip Exercises: Supine   Hip Adduction Isometric Both;1 set;10 reps    Bridges with Newman Pies Squeeze Both;1 set;15 reps      Knee/Hip Exercises: Sidelying   Hip ABduction Strengthening;Right;2 sets    Hip ABduction Limitations x 10      Manual  Therapy   Manual Therapy Passive ROM    Passive ROM Rt hip flexion, IR and LAD x 5 to Rt LE                  PT Education - 02/16/20 1911    Education Details cues for HEP, muscle imbalance in hips    Person(s) Educated Patient    Methods Explanation;Demonstration;Tactile cues;Verbal cues;Handout    Comprehension Verbalized understanding;Need further instruction;Verbal cues required            PT Short Term Goals - 02/08/20 1142      PT SHORT TERM GOAL #1   Title Pt will be I with basic HEP for strength, mobility    Baseline given on eval    Time 4    Period Weeks    Status New    Target Date 03/07/20      PT SHORT TERM GOAL #2   Title FOTO results will be reviewed and info related to goals    Time 4    Period Weeks    Status New    Target Date 03/07/20      PT SHORT TERM GOAL #3   Title Balance screening will be completed and goal set for reducing fall risk.    Time 4    Period Weeks    Status New    Target Date 03/07/20             PT Long Term Goals  - 02/08/20 1142      PT LONG TERM GOAL #1   Title FOTO score will improve to 59% able to more to demo improved functional mobility    Time 8    Period Weeks    Status New    Target Date 04/04/20      PT LONG TERM GOAL #2   Title balance goal TBA    Time 8    Period Weeks    Status New    Target Date 04/04/20      PT LONG TERM GOAL #3   Title Pt will be I with more advanced HEP for LE strength, balance    Time 8    Period Weeks    Status New    Target Date 04/04/20      PT LONG TERM GOAL #4   Title Patient will be able to walk dog using walking stick safely for 30 min at least 3 x per week.    Time 8    Period Weeks    Status New    Target Date 04/04/20      PT LONG TERM GOAL #5   Title Pt will perform floor transfer with Mod I for fall recovery    Time 8    Period Weeks    Status New    Target Date 04/04/20                 Plan - 02/16/20 1548    Clinical Impression Statement Session focused on checking HEP and providing cues for maximal benefit.  Difficulty maintaining neutral in Rt LE due to tightness in Rt lateral hip , possible tone issues.  Cues needed for glute activation, TFL overactive with hip abduction in standing or sidelying.  Will call for another appt early next week.    PT Treatment/Interventions ADLs/Self Care Home Management;Gait training;Therapeutic exercise;Patient/family education;Neuromuscular re-education;Balance training;Therapeutic activities;DME Instruction;Moist Heat;Traction;Functional mobility training;Stair training;Cryotherapy;Manual techniques    PT Next Visit Plan balance (parallel bars for  visual A )  progress PRN, NuStep, progress hip/general LE strengthening, initiate balance training    PT Home Exercise Plan [email or web address]    Consulted and Agree with Plan of Care Patient           Patient will benefit from skilled therapeutic intervention in order to improve the following deficits and impairments:  Abnormal gait,Decreased  balance,Decreased endurance,Decreased mobility,Difficulty walking,Obesity,Decreased range of motion,Decreased activity tolerance,Decreased strength,Increased fascial restricitons,Impaired flexibility,Impaired UE functional use,Postural dysfunction,Pain  Visit Diagnosis: Repeated falls  Abnormal posture  Pain in right hip  Muscle weakness (generalized)  Stiffness of right hip, not elsewhere classified     Problem List Patient Active Problem List   Diagnosis Date Noted  . DOE (dyspnea on exertion) 02/04/2020  . PAF (paroxysmal atrial fibrillation) (HCC) 02/04/2020  . Obesity, diabetes, and hypertension syndrome (HCC) 02/04/2020  . Mixed hyperlipidemia 02/04/2020    Juston Goheen 02/16/2020, 7:20 PM  [facility] [address] [place], [place], [address] Phone: [phone]   Fax:  [phone]  Name: TUCK DULWORTH MRN: [id number] Date of Birth: [birth year]  Karie Mainland, PT 02/16/20 7:21 PM Phone: [phone] Fax: [phone]

## 2020-02-22 ENCOUNTER — Encounter (HOSPITAL_COMMUNITY): Payer: Medicare Other

## 2020-02-22 ENCOUNTER — Encounter (HOSPITAL_COMMUNITY): Payer: Self-pay

## 2020-02-23 ENCOUNTER — Other Ambulatory Visit: Payer: Self-pay

## 2020-02-23 ENCOUNTER — Ambulatory Visit: Payer: Medicare Other | Admitting: Neurology

## 2020-02-23 ENCOUNTER — Encounter: Payer: Self-pay | Admitting: Neurology

## 2020-02-23 VITALS — BP 138/76 | HR 65 | Ht 71.0 in | Wt 261.0 lb

## 2020-02-23 DIAGNOSIS — G4719 Other hypersomnia: Secondary | ICD-10-CM | POA: Diagnosis not present

## 2020-02-23 DIAGNOSIS — R351 Nocturia: Secondary | ICD-10-CM

## 2020-02-23 DIAGNOSIS — Z8679 Personal history of other diseases of the circulatory system: Secondary | ICD-10-CM

## 2020-02-23 DIAGNOSIS — E669 Obesity, unspecified: Secondary | ICD-10-CM | POA: Diagnosis not present

## 2020-02-23 DIAGNOSIS — G4733 Obstructive sleep apnea (adult) (pediatric): Secondary | ICD-10-CM | POA: Diagnosis not present

## 2020-02-23 DIAGNOSIS — I499 Cardiac arrhythmia, unspecified: Secondary | ICD-10-CM

## 2020-02-23 NOTE — Progress Notes (Signed)
Subjective:    Patient ID: Bradley Ballard is a 70 y.o. male.  HPI     Huston Foley, MD, PhD [facility] [address], [address] [address] [place], [place] [address]  Dear Dr. Clelia Croft,   I saw your patient, Bradley Ballard, upon your kind request in my sleep clinic today for initial consultation of his sleep disorder, in particular, evaluation of his prior diagnosis of obstructive sleep apnea.  The patient is unaccompanied today.  As you know, Bradley Ballard is a 70 year old right-handed gentleman with an underlying medical history of diabetes, neuropathy, history of Bell's palsy, WPW, status post ablation, atrial fibrillation, BPH with status post TURP in April 2021, depression, cerebral palsy, hypertension, hyperlipidemia, reflux disease, and obesity, who was previously diagnosed with obstructive.  Prior sleep study results are not available for my review today.  Study was done in [place].  He estimates about 7 years ago.  He recalls that he may have had moderate obstructive sleep apnea at the time and CPAP was not covered by his insurance so he opted not to use it.  He worked on weight loss and lost a little bit of weight.  He is working on weight loss currently.  He recently saw cardiology and had a 30-day monitor placed.  He has a history of dyspnea on exertion and chest discomfort.  He denies any recent palpitations or going in and out of A. fib.  I reviewed your office note from 01/13/2020.  His Epworth sleepiness score is 12 out of 24, fatigue severity score is 57 out of 63.  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  He lives with his wife, they have two grown children.  He has one dog in the household who sleeps in their bedroom.  He does not have a TV in the bedroom.  He generally goes to bed around midnight, often after midnight.  His wife goes to bed earlier.  His rise time is 46 as he has to take his wife to work.  He often goes back  to bed after he takes her to work.  He also naps during the day.  He has nocturia about twice per average night and denies recurrent morning headaches.  He reports a history of left-sided Bell's palsy and a history of right-sided weakness secondary to his CP.  He has been in physical therapy because of joint deformity in the right foot and gait disorder.  His Past Medical History Is Significant For: Past Medical History:  Diagnosis Date  . Anxiety   . Arthritis   . BPH (benign prostatic hyperplasia)   . Cerebral palsy (HCC)    Right Leg Issues   . Depression   . Diabetes mellitus without complication (HCC)   . Dyspnea    exertion  . Dysrhythmia 2010  . GERD (gastroesophageal reflux disease)    Past - not often   . H/O Bell's palsy   . History of atrial fibrillation    s/p ablation   . Hyperlipidemia   . Hypertension   . Neuromuscular disorder (HCC)    peripheral neuropathy     His Past Surgical History Is Significant For: Past Surgical History:  Procedure Laterality Date  . APPENDECTOMY     age 26  . ATRIAL FIBRILLATION ABLATION    . CARDIAC CATHETERIZATION  2013  . COLONOSCOPY     ~15 yrs ago   . POLYPECTOMY     ~15 yrs  ago per pt had polyps x 3   . TRANSURETHRAL RESECTION OF PROSTATE N/A 05/11/2019   Procedure: TRANSURETHRAL RESECTION OF THE PROSTATE (TURP);  Surgeon: Noel Christmas, MD;  Location: [facility];  Service: Urology;  Laterality: N/A;  2 HRS  . UPPER GASTROINTESTINAL ENDOSCOPY      His Family History Is Significant For: Family History  Problem Relation Age of Onset  . Cancer Mother        started lung cancer, mets to intestine and then brain   . Colon cancer Neg Hx   . Colon polyps Neg Hx   . Esophageal cancer Neg Hx   . Rectal cancer Neg Hx   . Stomach cancer Neg Hx     His Social History Is Significant For: Social History   Socioeconomic History  . Marital status: Married    Spouse name: Not on file  . Number of children: Not on file  . Years  of education: Not on file  . Highest education level: Not on file  Occupational History  . Not on file  Tobacco Use  . Smoking status: Former Smoker    Quit date: 01/29/1992    Years since quitting: 28.0  . Smokeless tobacco: Never Used  Vaping Use  . Vaping Use: Never used  Substance and Sexual Activity  . Alcohol use: Yes    Comment: very seldom- 1 every 3 months  . Drug use: Never  . Sexual activity: Not on file  Other Topics Concern  . Not on file  Social History Narrative  . Not on file   Social Determinants of Health   Financial Resource Strain: Not on file  Food Insecurity: Not on file  Transportation Needs: Not on file  Physical Activity: Not on file  Stress: Not on file  Social Connections: Not on file    His Allergies Are:  No Known Allergies:   His Current Medications Are:  Outpatient Encounter Medications as of 02/23/2020  Medication Sig  . aspirin 81 MG chewable tablet Chew 81 mg by mouth daily.   Marland Kitchen atorvastatin (LIPITOR) 10 MG tablet Take 10 mg by mouth at bedtime.  Marland Kitchen buPROPion (WELLBUTRIN XL) 300 MG 24 hr tablet Take 300 mg by mouth daily.   . DULoxetine (CYMBALTA) 60 MG capsule Take 60 mg by mouth at bedtime.  . fenofibrate 54 MG tablet Take 54 mg by mouth daily.   . finasteride (PROSCAR) 5 MG tablet Take 5 mg by mouth daily.   . fluticasone (FLONASE) 50 MCG/ACT nasal spray Place 1-2 sprays into both nostrils daily as needed for allergies or rhinitis.  Marland Kitchen gabapentin (NEURONTIN) 300 MG capsule Take 300 mg by mouth 2 (two) times daily.   Marland Kitchen ibuprofen (ADVIL) 200 MG tablet Take 600-800 mg by mouth every 6 (six) hours as needed for moderate pain (Arthiritis).  . metFORMIN (GLUCOPHAGE-XR) 750 MG 24 hr tablet Take 750 mg by mouth at bedtime.  Marland Kitchen oxyCODONE-acetaminophen (PERCOCET) 5-325 MG tablet Take 1 tablet by mouth every 6 (six) hours as needed for severe pain.  Marland Kitchen PREVIDENT 5000 BOOSTER PLUS 1.1 % PSTE Place 1 application onto teeth in the morning and at  bedtime.   . sildenafil (REVATIO) 20 MG tablet Take 60-100 mg by mouth as needed (ED).  . tamsulosin (FLOMAX) 0.4 MG CAPS capsule Take 0.8 mg by mouth at bedtime.  . THERATEARS 0.25 % SOLN Place 1 drop into both eyes 3 (three) times daily as needed (dry/irritated eyes.).   No facility-administered  encounter medications on file as of 02/23/2020.  :  Review of Systems:  Out of a complete 14 point review of systems, all are reviewed and negative with the exception of these symptoms as listed below: Review of Systems  Neurological:       Here for sleep consult. Pt reports prior sleep study completed in [place] 5 plus years ago. Reports OSA was confirmed at the time of the sleep study, but pt did not start CPAP due to insurance coverage and cost of machine.   Epworth Sleepiness Scale 0= would never doze 1= slight chance of dozing 2= moderate chance of dozing 3= high chance of dozing  Sitting and reading:3 Watching TV:3 Sitting inactive in a public place (ex. Theater or meeting):0 As a passenger in a car for an hour without a break:3 Lying down to rest in the afternoon:3 Sitting and talking to someone:0 Sitting quietly after lunch (no alcohol):0 In a car, while stopped in traffic:0 Total:12      Objective:  Neurological Exam  Physical Exam Physical Examination:   Vitals:   02/23/20 1313  BP: 138/76  Pulse: 65  SpO2: 96%    General Examination: The patient is a very pleasant 70 y.o. male in no acute distress. He appears well-developed and well-nourished and well groomed.   HEENT: Normocephalic, atraumatic, pupils are equal, round and reactive to light, extraocular tracking is good without limitation to gaze excursion or nystagmus noted. Hearing is grossly intact. Face is slightly asymmetric with left lower facial weakness noted, fairly good eye closure noted, no obvious Bell's phenomenon.  Speech is clear without dysarthria, hypophonia or voice tremor, airway examination  reveals moderate airway crowding, secondary to larger uvula, tonsils on the small average size.  Tongue protrudes centrally and palate elevates symmetrically.  Mallampati class II, neck circumference of 19 1/4 inches.   Chest: Clear to auscultation without wheezing, rhonchi or crackles noted.  Heart: S1+S2+0, somewhat irregular.   Abdomen: Soft, non-tender and non-distended with normal bowel sounds appreciated on auscultation.  Extremities: There is 1+ pitting edema in the distal lower extremities bilaterally.  He wears a compression sock up to the knee on the right.  Skin: Warm and dry without trophic changes noted.   Musculoskeletal: exam reveals right foot inversion.   Neurologically:  Mental status: The patient is awake, alert and oriented in all 4 spheres. His immediate and remote memory, attention, language skills and fund of knowledge are appropriate. There is no evidence of aphasia, agnosia, apraxia or anomia. Speech is clear with normal prosody and enunciation. Thought process is linear. Mood is normal and affect is normal.  Cranial nerves II - XII are as described above under HEENT exam.  Motor exam: Normal bulk, strength and tone is noted. There is no tremor. Fine motor skills and coordination: grossly intact.  Cerebellar testing: No dysmetria or intention tremor. There is no truncal or gait ataxia.  Sensory exam: intact to light touch in the upper and lower extremities.  Gait, station and balance: He stands with mild difficulty and pushes himself up.  He has right foot inversion.  He walks with a limp, he has no walking aid but reports that he typically walks with a cane.   Assessment and Plan:   In summary, Bradley Ballard is a very pleasant 70 y.o.-year old male  with an underlying medical history of diabetes, neuropathy, history of Bell's palsy, WPW, status post ablation, atrial fibrillation, BPH with status post TURP in April 2021, depression,  cerebral palsy, hypertension,  hyperlipidemia, reflux disease, and obesity, who presents for evaluation of his obstructive sleep apnea (OSA). I had a long chat with the patient about my findings and the diagnosis of OSA, its prognosis and treatment options. We talked about medical treatments, surgical interventions and non-pharmacological approaches. I explained in particular the risks and ramifications of untreated moderate to severe OSA, especially with respect to developing cardiovascular disease down the Road, including congestive heart failure, difficult to treat hypertension, cardiac arrhythmias, or stroke. Even type 2 diabetes has, in part, been linked to untreated OSA. Symptoms of untreated OSA include daytime sleepiness, memory problems, mood irritability and mood disorder such as depression and anxiety, lack of energy, as well as recurrent headaches, especially morning headaches. We talked about maintaining a healthy lifestyle in general, as well as the importance of weight control. We also talked about the importance of good sleep hygiene. I recommended the following at this time: sleep study.   I explained the sleep test procedure to the patient and also outlined possible surgical and non-surgical treatment options of OSA, including the use of a custom-made dental device (which would require a referral to a specialist dentist or oral surgeon), upper airway surgical options, such as traditional UPPP or a novel less invasive surgical option in the form of Inspire hypoglossal nerve stimulation (which would involve a referral to an ENT surgeon). I also explained the CPAP treatment option to the patient, who indicated that he would be willing to try CPAP if the need arises. I explained the importance of being compliant with PAP treatment, not only for insurance purposes but primarily to improve His symptoms, and for the patient's long term health benefit, including to reduce Her cardiovascular risks. I answered all his questions today  and the patient was in agreement. I plan to see him back after the sleep study is completed and encouraged him to call with any interim questions, concerns, problems or updates.   Thank you very much for allowing me to participate in the care of this nice patient. If I can be of any further assistance to you please do not hesitate to call me at [phone].  Sincerely,   Huston Foley, MD, PhD

## 2020-02-23 NOTE — Patient Instructions (Signed)

## 2020-02-24 ENCOUNTER — Ambulatory Visit: Payer: Medicare Other | Admitting: Physical Therapy

## 2020-02-24 ENCOUNTER — Encounter: Payer: Self-pay | Admitting: Physical Therapy

## 2020-02-24 ENCOUNTER — Other Ambulatory Visit: Payer: Self-pay

## 2020-02-24 DIAGNOSIS — M6281 Muscle weakness (generalized): Secondary | ICD-10-CM

## 2020-02-24 DIAGNOSIS — R296 Repeated falls: Secondary | ICD-10-CM | POA: Diagnosis not present

## 2020-02-24 DIAGNOSIS — R293 Abnormal posture: Secondary | ICD-10-CM

## 2020-02-24 DIAGNOSIS — M25651 Stiffness of right hip, not elsewhere classified: Secondary | ICD-10-CM

## 2020-02-24 DIAGNOSIS — M25551 Pain in right hip: Secondary | ICD-10-CM

## 2020-02-24 NOTE — Therapy (Addendum)
[facility], [place], [address] Phone: [phone]   Fax:  [phone]  Physical Therapy Treatment  Patient Details  Name: Bradley Ballard MRN: [id number] Date of Birth: [birth year] Referring Provider (PT): Dr. Marton Redwood   Encounter Date: 02/24/2020   PT End of Session - 02/24/20 0805    Visit Number 4    Number of Visits 16    Date for PT Re-Evaluation 04/04/20    Authorization Type MCR    PT Start Time 0800    PT Stop Time 0845    PT Time Calculation (min) 45 min           Past Medical History:  Diagnosis Date  . Anxiety   . Arthritis   . BPH (benign prostatic hyperplasia)   . Cerebral palsy (HCC)    Right Leg Issues   . Depression   . Diabetes mellitus without complication ([place])   . Dyspnea    exertion  . Dysrhythmia 2010  . GERD (gastroesophageal reflux disease)    Past - not often   . H/O Bell's palsy   . History of atrial fibrillation    s/p ablation   . Hyperlipidemia   . Hypertension   . Neuromuscular disorder ([place])    peripheral neuropathy     Past Surgical History:  Procedure Laterality Date  . APPENDECTOMY     age 70  . ATRIAL FIBRILLATION ABLATION    . CARDIAC CATHETERIZATION  2013  . COLONOSCOPY     ~15 yrs ago   . POLYPECTOMY     ~15 yrs ago per pt had polyps x 3   . TRANSURETHRAL RESECTION OF PROSTATE N/A 05/11/2019   Procedure: TRANSURETHRAL RESECTION OF THE PROSTATE (TURP);  Surgeon: Robley Fries, MD;  Location: [facility];  Service: Urology;  Laterality: N/A;  2 HRS  . UPPER GASTROINTESTINAL ENDOSCOPY      There were no vitals filed for this visit.   Subjective Assessment - 02/24/20 0804    Subjective Right hip is 7/10. Yesterday my legs felt really weak. Today they feel better.    Pertinent History Cerebral palsy.  A fib, neuropathy, neck pain , shoulder    Currently in Pain? Yes    Pain Score 7     Pain Location Hip    Pain Orientation  Right;Lateral;Posterior    Pain Descriptors / Indicators Aching;Tightness    Pain Type Chronic pain    Aggravating Factors  standing, walking    Pain Relieving Factors laying down                             [facility] Adult PT Treatment/Exercise - 02/24/20 0001      Knee/Hip Exercises: Stretches   Active Hamstring Stretch Limitations seated EOM x 3 each    Passive Hamstring Stretch 30 seconds;3 reps;Right    Hip Flexor Stretch Right;3 reps;30 seconds    Piriformis Stretch 2 reps;30 seconds    Piriformis Stretch Limitations knees crossed with rotation   also figure 4 with over pressure     Knee/Hip Exercises: Aerobic   Nustep LE and UE for 6 min L 5      Knee/Hip Exercises: Standing   Heel Raises 10 reps    Heel Raises Limitations heel toe rocking    Hip Flexion Limitations alternating march in parallel bars, bilat UE    Hip Abduction Both;10 reps;2 sets  Knee/Hip Exercises: Supine   Bridges 10 reps   5 sec hold     Manual Therapy   Passive ROM right hip flexion, passive Hip IR/ER, hamstring               Balance Exercises - 02/24/20 0001      Balance Exercises: Standing   Standing Eyes Opened Narrow base of support (BOS);Head turns;Solid surface   intermittent touch as needed   Tandem Stance Eyes open;Intermittent upper extremity support;3 reps    Tandem Gait Forward;Upper extremity support    Sidestepping 4 reps               PT Short Term Goals - 02/24/20 0854      PT SHORT TERM GOAL #1   Title Pt will be I with basic HEP for strength, mobility    Baseline pt is independent and compliant    Time 4    Period Weeks    Status Achieved      PT SHORT TERM GOAL #2   Title FOTO results will be reviewed and info related to goals    Baseline reviewed on 02/24/20    Time 4    Period Weeks    Status Achieved      PT SHORT TERM GOAL #3   Title Balance screening will be completed and goal set for reducing fall risk.    Baseline BERG 42/56,  need to set goal    Time 4    Period Weeks    Status Partially Met             PT Long Term Goals - 02/08/20 1142      PT LONG TERM GOAL #1   Title FOTO score will improve to 59% able to more to demo improved functional mobility    Time 8    Period Weeks    Status New    Target Date 04/04/20      PT LONG TERM GOAL #2   Title balance goal TBA    Time 8    Period Weeks    Status New    Target Date 04/04/20      PT LONG TERM GOAL #3   Title Pt will be I with more advanced HEP for LE strength, balance    Time 8    Period Weeks    Status New    Target Date 04/04/20      PT LONG TERM GOAL #4   Title Patient will be able to walk dog using walking stick safely for 30 min at least 3 x per week.    Time 8    Period Weeks    Status New    Target Date 04/04/20      PT LONG TERM GOAL #5   Title Pt will perform floor transfer with Mod I for fall recovery    Time 8    Period Weeks    Status New    Target Date 04/04/20                 Plan - 02/24/20 0843    Clinical Impression Statement Session focused on beginning balance training in parallel bars. Continued with hip stretching and strengthening closed chain and open chain. pt tolerated session well with fatigue, no c/o pain. he needed frequent UE assist to prevent LOB with rhomberg and tandem stance.    PT Next Visit Plan balance (parallel bars for visual A )  progress PRN, NuStep, progress  hip/general LE strengthening, initiate balance training    PT Home Exercise Plan [id number]           Patient will benefit from skilled therapeutic intervention in order to improve the following deficits and impairments:  Abnormal gait,Decreased balance,Decreased endurance,Decreased mobility,Difficulty walking,Obesity,Decreased range of motion,Decreased activity tolerance,Decreased strength,Increased fascial restricitons,Impaired flexibility,Impaired UE functional use,Postural dysfunction,Pain  Visit Diagnosis: Repeated  falls  Abnormal posture  Pain in right hip  Muscle weakness (generalized)  Stiffness of right hip, not elsewhere classified     Problem List Patient Active Problem List   Diagnosis Date Noted  . DOE (dyspnea on exertion) 02/04/2020  . PAF (paroxysmal atrial fibrillation) ([place]) 02/04/2020  . Obesity, diabetes, and hypertension syndrome ([place]) 02/04/2020  . Mixed hyperlipidemia 02/04/2020    Dorene Ar, PTA 02/24/2020, 8:56 AM  [facility] [address] [place], [place], [address] Phone: [phone]   Fax:  [phone]  Name: UVALDO RYBACKI MRN: [id number] Date of Birth: 24-Mar-1950

## 2020-02-28 ENCOUNTER — Ambulatory Visit: Payer: Medicare Other | Admitting: Physical Therapy

## 2020-02-28 ENCOUNTER — Other Ambulatory Visit: Payer: Self-pay

## 2020-02-28 ENCOUNTER — Encounter: Payer: Self-pay | Admitting: Physical Therapy

## 2020-02-28 DIAGNOSIS — R293 Abnormal posture: Secondary | ICD-10-CM

## 2020-02-28 DIAGNOSIS — M25651 Stiffness of right hip, not elsewhere classified: Secondary | ICD-10-CM

## 2020-02-28 DIAGNOSIS — M25551 Pain in right hip: Secondary | ICD-10-CM

## 2020-02-28 DIAGNOSIS — R296 Repeated falls: Secondary | ICD-10-CM

## 2020-02-28 DIAGNOSIS — M6281 Muscle weakness (generalized): Secondary | ICD-10-CM

## 2020-02-28 NOTE — Therapy (Signed)
[place], [place], [address] Phone: [phone]   Fax:  [phone]  Physical Therapy Treatment  Patient Details  Name: Bradley Ballard MRN: [id number] Date of Birth: [birth year] Referring Provider (PT): Dr. Marton Redwood   Encounter Date: 02/28/2020   PT End of Session - 02/28/20 0806    Visit Number 5    Number of Visits 16    Date for PT Re-Evaluation 04/04/20    Authorization Type MCR    PT Start Time 0800    PT Stop Time 0845    PT Time Calculation (min) 45 min           Past Medical History:  Diagnosis Date  . Anxiety   . Arthritis   . BPH (benign prostatic hyperplasia)   . Cerebral palsy (HCC)    Right Leg Issues   . Depression   . Diabetes mellitus without complication ([place])   . Dyspnea    exertion  . Dysrhythmia 2010  . GERD (gastroesophageal reflux disease)    Past - not often   . H/O Bell's palsy   . History of atrial fibrillation    s/p ablation   . Hyperlipidemia   . Hypertension   . Neuromuscular disorder ([place])    peripheral neuropathy     Past Surgical History:  Procedure Laterality Date  . APPENDECTOMY     age 44  . ATRIAL FIBRILLATION ABLATION    . CARDIAC CATHETERIZATION  2013  . COLONOSCOPY     ~15 yrs ago   . POLYPECTOMY     ~15 yrs ago per pt had polyps x 3   . TRANSURETHRAL RESECTION OF PROSTATE N/A 05/11/2019   Procedure: TRANSURETHRAL RESECTION OF THE PROSTATE (TURP);  Surgeon: Robley Fries, MD;  Location: [facility];  Service: Urology;  Laterality: N/A;  2 HRS  . UPPER GASTROINTESTINAL ENDOSCOPY      There were no vitals filed for this visit.   Subjective Assessment - 02/28/20 0805    Subjective I was sore in my legs and shoulders after last visit. My hip pain is 6/10 today.    Currently in Pain? Yes    Pain Score 6     Pain Location Hip    Pain Orientation Right;Lateral;Posterior    Pain Descriptors / Indicators Aching;Tightness    Pain Type  Chronic pain    Aggravating Factors  standing and walking    Pain Relieving Factors laying down                             [facility] Adult PT Treatment/Exercise - 02/28/20 0001      Knee/Hip Exercises: Stretches   Active Hamstring Stretch Limitations seated EOM x 3 each    Piriformis Stretch 2 reps;30 seconds    Piriformis Stretch Limitations seated      Knee/Hip Exercises: Aerobic   Nustep LE and UE for 6 min L 5      Knee/Hip Exercises: Standing   Hip Flexion Limitations alternating march in parallel bars, bilat UE    Hip Abduction Both;10 reps;2 sets               Balance Exercises - 02/28/20 0001      Balance Exercises: Standing   Standing Eyes Opened Narrow base of support (BOS);Head turns;Solid surface;Foam/compliant surface   intermittent touch as needed   Tandem Gait Forward;Upper extremity support    Partial Tandem  Stance Eyes open;Intermittent upper extremity support   head turns   Retro Gait 4 reps   parallel bars, light touch   Sidestepping 4 reps   light touch   Marching Foam/compliant surface;Solid surface;Upper extremity assist 1    Sit to Stand Elevated surface;Without upper extremity support               PT Short Term Goals - 02/24/20 0854      PT SHORT TERM GOAL #1   Title Pt will be I with basic HEP for strength, mobility    Baseline pt is independent and compliant    Time 4    Period Weeks    Status Achieved      PT SHORT TERM GOAL #2   Title FOTO results will be reviewed and info related to goals    Baseline reviewed on 02/24/20    Time 4    Period Weeks    Status Achieved      PT SHORT TERM GOAL #3   Title Balance screening will be completed and goal set for reducing fall risk.    Baseline BERG 42/56, need to set goal    Time 4    Period Weeks    Status Partially Met             PT Long Term Goals - 02/08/20 1142      PT LONG TERM GOAL #1   Title FOTO score will improve to 59% able to more to demo  improved functional mobility    Time 8    Period Weeks    Status New    Target Date 04/04/20      PT LONG TERM GOAL #2   Title balance goal TBA    Time 8    Period Weeks    Status New    Target Date 04/04/20      PT LONG TERM GOAL #3   Title Pt will be I with more advanced HEP for LE strength, balance    Time 8    Period Weeks    Status New    Target Date 04/04/20      PT LONG TERM GOAL #4   Title Patient will be able to walk dog using walking stick safely for 30 min at least 3 x per week.    Time 8    Period Weeks    Status New    Target Date 04/04/20      PT LONG TERM GOAL #5   Title Pt will perform floor transfer with Mod I for fall recovery    Time 8    Period Weeks    Status New    Target Date 04/04/20                 Plan - 02/28/20 0847    Clinical Impression Statement Pt arrives with 6/10 hip pain. Session focused on balance and closed chain LE strength in parallel bars. Pt required at least 1 UE light touch for all balance exercises. He tolearated the session well with 4 seated rest breaks.    PT Next Visit Plan balance (parallel bars for visual A )  progress PRN, NuStep, progress hip/general LE strengthening, continue balance training    PT Home Exercise Plan [id number]    Consulted and Agree with Plan of Care Patient           Patient will benefit from skilled therapeutic intervention in order to improve the following deficits and  impairments:  Abnormal gait,Decreased balance,Decreased endurance,Decreased mobility,Difficulty walking,Obesity,Decreased range of motion,Decreased activity tolerance,Decreased strength,Increased fascial restricitons,Impaired flexibility,Impaired UE functional use,Postural dysfunction,Pain  Visit Diagnosis: Repeated falls  Abnormal posture  Pain in right hip  Muscle weakness (generalized)  Stiffness of right hip, not elsewhere classified     Problem List Patient Active Problem List   Diagnosis Date Noted  .  DOE (dyspnea on exertion) 02/04/2020  . PAF (paroxysmal atrial fibrillation) ([place]) 02/04/2020  . Obesity, diabetes, and hypertension syndrome ([place]) 02/04/2020  . Mixed hyperlipidemia 02/04/2020    Dorene Ar, PTA 02/28/2020, 9:00 AM  [facility] [address] [place], [place], [address] Phone: [phone]   Fax:  [phone]  Name: Bradley Ballard MRN: [id number] Date of Birth: 14-Apr-1950

## 2020-03-03 ENCOUNTER — Encounter: Payer: Self-pay | Admitting: Physical Therapy

## 2020-03-03 ENCOUNTER — Ambulatory Visit: Payer: Medicare Other | Attending: Internal Medicine | Admitting: Physical Therapy

## 2020-03-03 ENCOUNTER — Other Ambulatory Visit: Payer: Self-pay

## 2020-03-03 DIAGNOSIS — R293 Abnormal posture: Secondary | ICD-10-CM | POA: Diagnosis present

## 2020-03-03 DIAGNOSIS — M25651 Stiffness of right hip, not elsewhere classified: Secondary | ICD-10-CM | POA: Diagnosis present

## 2020-03-03 DIAGNOSIS — M25551 Pain in right hip: Secondary | ICD-10-CM | POA: Insufficient documentation

## 2020-03-03 DIAGNOSIS — M6281 Muscle weakness (generalized): Secondary | ICD-10-CM | POA: Diagnosis present

## 2020-03-03 DIAGNOSIS — R296 Repeated falls: Secondary | ICD-10-CM | POA: Insufficient documentation

## 2020-03-03 NOTE — Therapy (Signed)
[place], [place], [address] Phone: [phone]   Fax:  [phone]  Physical Therapy Treatment  Patient Details  Name: Bradley Ballard MRN: [id number] Date of Birth: 12-Oct-1950 Referring Provider (PT): Dr. Marton Redwood   Encounter Date: 03/03/2020   PT End of Session - 03/03/20 0824    Visit Number 6    Number of Visits 16    Date for PT Re-Evaluation 04/04/20    Authorization Type MCR    PT Start Time 0800    PT Stop Time 0845    PT Time Calculation (min) 45 min           Past Medical History:  Diagnosis Date  . Anxiety   . Arthritis   . BPH (benign prostatic hyperplasia)   . Cerebral palsy (HCC)    Right Leg Issues   . Depression   . Diabetes mellitus without complication ([place])   . Dyspnea    exertion  . Dysrhythmia 2010  . GERD (gastroesophageal reflux disease)    Past - not often   . H/O Bell's palsy   . History of atrial fibrillation    s/p ablation   . Hyperlipidemia   . Hypertension   . Neuromuscular disorder ([place])    peripheral neuropathy     Past Surgical History:  Procedure Laterality Date  . APPENDECTOMY     age 30  . ATRIAL FIBRILLATION ABLATION    . CARDIAC CATHETERIZATION  2013  . COLONOSCOPY     ~15 yrs ago   . POLYPECTOMY     ~15 yrs ago per pt had polyps x 3   . TRANSURETHRAL RESECTION OF PROSTATE N/A 05/11/2019   Procedure: TRANSURETHRAL RESECTION OF THE PROSTATE (TURP);  Surgeon: Robley Fries, MD;  [facility];  Service: Urology;  Laterality: N/A;  2 HRS  . UPPER GASTROINTESTINAL ENDOSCOPY      There were no vitals filed for this visit.   Subjective Assessment - 03/03/20 0806    Subjective I fell a couple of days ago, my dog tripped me. I fell on my right side.    Currently in Pain? Yes    Pain Score 6     Pain Location Hip    Pain Orientation Right;Posterior;Lateral    Pain Descriptors / Indicators Aching;Tightness    Pain Type Chronic pain                              [facility] Adult PT Treatment/Exercise - 03/03/20 0001      Knee/Hip Exercises: Stretches   Active Hamstring Stretch Limitations seated EOM x 3 each    Hip Flexor Stretch Right;3 reps;30 seconds    Other Knee/Hip Stretches LTR x 10; Hooklying figure-4 hip IR stretch x 30 sec      Knee/Hip Exercises: Aerobic   Nustep LE and UE for 6 min L 5   SPO2 96% , HR 105 bpm at end     Knee/Hip Exercises: Seated   Sit to Sand 10 reps   CGA, no UE     Knee/Hip Exercises: Supine   Bridges 10 reps   5 sec hold   Bridges Limitations 2 sets    Other Supine Knee/Hip Exercises Alternating march with green band x 20, clam x 20 green band      Knee/Hip Exercises: Sidelying   Hip ABduction 10 reps;Right;Left    Clams x 20 each  Balance Exercises - 03/03/20 0001      Balance Exercises: Standing   Standing Eyes Opened Narrow base of support (BOS);Head turns;Solid surface;Foam/compliant surface   intermittent touch as needed   Tandem Stance Eyes open;Intermittent upper extremity support;3 reps    Marching Foam/compliant surface;Solid surface;Upper extremity assist 1               PT Short Term Goals - 02/24/20 0854      PT SHORT TERM GOAL #1   Title Pt will be I with basic HEP for strength, mobility    Baseline pt is independent and compliant    Time 4    Period Weeks    Status Achieved      PT SHORT TERM GOAL #2   Title FOTO results will be reviewed and info related to goals    Baseline reviewed on 02/24/20    Time 4    Period Weeks    Status Achieved      PT SHORT TERM GOAL #3   Title Balance screening will be completed and goal set for reducing fall risk.    Baseline BERG 42/56, need to set goal    Time 4    Period Weeks    Status Partially Met             PT Long Term Goals - 02/08/20 1142      PT LONG TERM GOAL #1   Title FOTO score will improve to 59% able to more to demo improved functional mobility     Time 8    Period Weeks    Status New    Target Date 04/04/20      PT LONG TERM GOAL #2   Title balance goal TBA    Time 8    Period Weeks    Status New    Target Date 04/04/20      PT LONG TERM GOAL #3   Title Pt will be I with more advanced HEP for LE strength, balance    Time 8    Period Weeks    Status New    Target Date 04/04/20      PT LONG TERM GOAL #4   Title Patient will be able to walk dog using walking stick safely for 30 min at least 3 x per week.    Time 8    Period Weeks    Status New    Target Date 04/04/20      PT LONG TERM GOAL #5   Title Pt will perform floor transfer with Mod I for fall recovery    Time 8    Period Weeks    Status New    Target Date 04/04/20                 Plan - 03/03/20 1115    Clinical Impression Statement Pt enters without AD and reports he left it in the car. He reports fall onto right side 2 days ago after being tripped by his dog. He was carrying his walking stick at the time. He needed assistance from others to get up after fall. He reports no injury from fall. Continued with balance and LE strenghening, working toward [place].    PT Next Visit Plan balance (parallel bars for visual A )  progress PRN, NuStep, progress hip/general LE strengthening, continue balance training    PT Home Exercise Plan [id number]    Consulted and Agree with Plan of Care Patient  Patient will benefit from skilled therapeutic intervention in order to improve the following deficits and impairments:  Abnormal gait,Decreased balance,Decreased endurance,Decreased mobility,Difficulty walking,Obesity,Decreased range of motion,Decreased activity tolerance,Decreased strength,Increased fascial restricitons,Impaired flexibility,Impaired UE functional use,Postural dysfunction,Pain  Visit Diagnosis: Repeated falls  Abnormal posture  Pain in right hip  Muscle weakness (generalized)  Stiffness of right hip, not elsewhere  classified     Problem List Patient Active Problem List   Diagnosis Date Noted  . DOE (dyspnea on exertion) 02/04/2020  . PAF (paroxysmal atrial fibrillation) ([place]) 02/04/2020  . Obesity, diabetes, and hypertension syndrome ([place]) 02/04/2020  . Mixed hyperlipidemia 02/04/2020    Dorene Ar, PTA 03/03/2020, 10:23 AM  [facility] [address] [place], [place], [address] Phone: [phone]   Fax:  [phone]  Name: INDY KUCK MRN: [id number] Date of Birth: [birth year]

## 2020-03-07 ENCOUNTER — Ambulatory Visit: Payer: Medicare Other | Admitting: Physical Therapy

## 2020-03-07 ENCOUNTER — Other Ambulatory Visit: Payer: Self-pay

## 2020-03-07 VITALS — BP 97/89 | HR 121

## 2020-03-07 DIAGNOSIS — M25651 Stiffness of right hip, not elsewhere classified: Secondary | ICD-10-CM

## 2020-03-07 DIAGNOSIS — M25551 Pain in right hip: Secondary | ICD-10-CM

## 2020-03-07 DIAGNOSIS — R296 Repeated falls: Secondary | ICD-10-CM | POA: Diagnosis not present

## 2020-03-07 DIAGNOSIS — M6281 Muscle weakness (generalized): Secondary | ICD-10-CM

## 2020-03-07 DIAGNOSIS — R293 Abnormal posture: Secondary | ICD-10-CM

## 2020-03-07 NOTE — Therapy (Signed)
[place], [place], [address] Phone: [phone]   Fax:  [phone]  Physical Therapy Treatment  Patient Details  Name: Bradley Ballard MRN: [id number] Date of Birth: 10-Jun-1950 Referring Provider (PT): Dr. Marton Redwood   Encounter Date: 03/07/2020   PT End of Session - 03/07/20 0936    Visit Number 7    Number of Visits 16    Date for PT Re-Evaluation 04/04/20    Authorization Type MCR    PT Start Time 0922    PT Stop Time 1000    PT Time Calculation (min) 38 min    Activity Tolerance Patient tolerated treatment well    Behavior During Therapy [facility] for tasks assessed/performed;Anxious           Past Medical History:  Diagnosis Date  . Anxiety   . Arthritis   . BPH (benign prostatic hyperplasia)   . Cerebral palsy (HCC)    Right Leg Issues   . Depression   . Diabetes mellitus without complication ([place])   . Dyspnea    exertion  . Dysrhythmia 2010  . GERD (gastroesophageal reflux disease)    Past - not often   . H/O Bell's palsy   . History of atrial fibrillation    s/p ablation   . Hyperlipidemia   . Hypertension   . Neuromuscular disorder ([place])    peripheral neuropathy     Past Surgical History:  Procedure Laterality Date  . APPENDECTOMY     age 36  . ATRIAL FIBRILLATION ABLATION    . CARDIAC CATHETERIZATION  2013  . COLONOSCOPY     ~15 yrs ago   . POLYPECTOMY     ~15 yrs ago per pt had polyps x 3   . TRANSURETHRAL RESECTION OF PROSTATE N/A 05/11/2019   Procedure: TRANSURETHRAL RESECTION OF THE PROSTATE (TURP);  Surgeon: Robley Fries, MD;  [facility];  Service: Urology;  Laterality: N/A;  2 HRS  . UPPER GASTROINTESTINAL ENDOSCOPY      Vitals:   03/07/20 0936 03/07/20 0937 03/07/20 0959  BP: (!) 89/66 115/71 97/89  Pulse:  (!) 121      Subjective Assessment - 03/07/20 0925    Subjective I feel dizzy and lightheaded today when I stand up.  Wearing HR monitor for  possible murmur, excessive sleepiness. Time spent early in session getting HR down then using the rest room.    Currently in Pain? No/denies            [facility] Adult PT Treatment/Exercise - 03/07/20 0001      Self-Care   Self-Care Other Self-Care Comments    Other Self-Care Comments  BP, normal readings, symptoms and importance of hydration.  Consult cardiologist for symptoms and water intake      Neck Exercises: Supine   Neck Retraction 10 reps;5 secs    Neck Retraction Limitations added head turns x 10 each then opposing Lower trunk rotation    Other Supine Exercise Ab set x 10      Knee/Hip Exercises: Supine   Bridges 1 set;10 reps    Straight Leg Raises Both;1 set;10 reps    Straight Leg Raises Limitations knee flexed, poor control , focus on exhale otlift limb                  PT Education - 03/07/20 0953    Education Details breathing, water intake, core , POC    Person(s) Educated Patient  Methods Explanation;Handout    Comprehension Verbalized understanding;Returned demonstration            PT Short Term Goals - 02/24/20 0854      PT SHORT TERM GOAL #1   Title Pt will be I with basic HEP for strength, mobility    Baseline pt is independent and compliant    Time 4    Period Weeks    Status Achieved      PT SHORT TERM GOAL #2   Title FOTO results will be reviewed and info related to goals    Baseline reviewed on 02/24/20    Time 4    Period Weeks    Status Achieved      PT SHORT TERM GOAL #3   Title Balance screening will be completed and goal set for reducing fall risk.    Baseline BERG 42/56, need to set goal    Time 4    Period Weeks    Status Partially Met             PT Long Term Goals - 02/08/20 1142      PT LONG TERM GOAL #1   Title FOTO score will improve to 59% able to more to demo improved functional mobility    Time 8    Period Weeks    Status New    Target Date 04/04/20      PT LONG TERM GOAL #2   Title balance goal TBA     Time 8    Period Weeks    Status New    Target Date 04/04/20      PT LONG TERM GOAL #3   Title Pt will be I with more advanced HEP for LE strength, balance    Time 8    Period Weeks    Status New    Target Date 04/04/20      PT LONG TERM GOAL #4   Title Patient will be able to walk dog using walking stick safely for 30 min at least 3 x per week.    Time 8    Period Weeks    Status New    Target Date 04/04/20      PT LONG TERM GOAL #5   Title Pt will perform floor transfer with Mod I for fall recovery    Time 8    Period Weeks    Status New    Target Date 04/04/20                 Plan - 03/07/20 0947    Clinical Impression Statement Pt with cane today.  Lost minutes due to bathroom break.  Symptoms limited patient to perform mat exercises.  Tachycardia with low blood pressure initially, improved with seated position and resting.  BP and note sent to cardiologist. Felt better after drinking water and doing light exercise.    PT Treatment/Interventions ADLs/Self Care Home Management;Gait training;Therapeutic exercise;Patient/family education;Neuromuscular re-education;Balance training;Therapeutic activities;DME Instruction;Moist Heat;Traction;Functional mobility training;Stair training;Cryotherapy;Manual techniques    PT Next Visit Plan balance (parallel bars for visual A )  progress PRN, NuStep, progress hip/general LE strengthening, continue balance training    PT Home Exercise Plan [id number]    Consulted and Agree with Plan of Care Patient           Patient will benefit from skilled therapeutic intervention in order to improve the following deficits and impairments:  Abnormal gait,Decreased balance,Decreased endurance,Decreased mobility,Difficulty walking,Obesity,Decreased range of motion,Decreased activity tolerance,Decreased strength,Increased fascial restricitons,Impaired  flexibility,Impaired UE functional use,Postural dysfunction,Pain  Visit Diagnosis: Repeated  falls  Abnormal posture  Pain in right hip  Muscle weakness (generalized)  Stiffness of right hip, not elsewhere classified     Problem List Patient Active Problem List   Diagnosis Date Noted  . DOE (dyspnea on exertion) 02/04/2020  . PAF (paroxysmal atrial fibrillation) ([place]) 02/04/2020  . Obesity, diabetes, and hypertension syndrome ([place]) 02/04/2020  . Mixed hyperlipidemia 02/04/2020    Cristal Qadir 03/07/2020, 10:09 AM  [facility] [address] [place], [place], [address] Phone: [phone]   Fax:  [phone]  Name: Bradley Ballard MRN: [id number] Date of Birth: 03-Jan-1951  Raeford Razor, PT 03/07/20 10:10 AM Phone: [phone] Fax: [phone]

## 2020-03-09 ENCOUNTER — Encounter: Payer: Self-pay | Admitting: Physical Therapy

## 2020-03-09 ENCOUNTER — Other Ambulatory Visit: Payer: Self-pay

## 2020-03-09 ENCOUNTER — Ambulatory Visit: Payer: Medicare Other | Admitting: Physical Therapy

## 2020-03-09 VITALS — BP 157/97

## 2020-03-09 DIAGNOSIS — M25551 Pain in right hip: Secondary | ICD-10-CM

## 2020-03-09 DIAGNOSIS — R296 Repeated falls: Secondary | ICD-10-CM

## 2020-03-09 DIAGNOSIS — M6281 Muscle weakness (generalized): Secondary | ICD-10-CM

## 2020-03-09 DIAGNOSIS — M25651 Stiffness of right hip, not elsewhere classified: Secondary | ICD-10-CM

## 2020-03-09 DIAGNOSIS — R293 Abnormal posture: Secondary | ICD-10-CM

## 2020-03-09 NOTE — Therapy (Signed)
[place], [place], [address] Phone: [phone]   Fax:  [phone]  Physical Therapy Treatment  Patient Details  Name: Bradley Ballard MRN: [id number] Date of Birth: April 24, 1950 Referring Provider (PT): Dr. Marton Redwood   Encounter Date: 03/09/2020   PT End of Session - 03/09/20 0919    Visit Number 8    Number of Visits 16    Date for PT Re-Evaluation 04/04/20    Authorization Type MCR    PT Start Time 0847    PT Stop Time 0920    PT Time Calculation (min) 33 min    Activity Tolerance Patient tolerated treatment well    Behavior During Therapy [facility] for tasks assessed/performed           Past Medical History:  Diagnosis Date  . Anxiety   . Arthritis   . BPH (benign prostatic hyperplasia)   . Cerebral palsy (HCC)    Right Leg Issues   . Depression   . Diabetes mellitus without complication ([place])   . Dyspnea    exertion  . Dysrhythmia 2010  . GERD (gastroesophageal reflux disease)    Past - not often   . H/O Bell's palsy   . History of atrial fibrillation    s/p ablation   . Hyperlipidemia   . Hypertension   . Neuromuscular disorder ([place])    peripheral neuropathy     Past Surgical History:  Procedure Laterality Date  . APPENDECTOMY     age 88  . ATRIAL FIBRILLATION ABLATION    . CARDIAC CATHETERIZATION  2013  . COLONOSCOPY     ~15 yrs ago   . POLYPECTOMY     ~15 yrs ago per pt had polyps x 3   . TRANSURETHRAL RESECTION OF PROSTATE N/A 05/11/2019   Procedure: TRANSURETHRAL RESECTION OF THE PROSTATE (TURP);  Surgeon: Robley Fries, MD;  [facility];  Service: Urology;  Laterality: N/A;  2 HRS  . UPPER GASTROINTESTINAL ENDOSCOPY      Vitals:   03/09/20 0858 03/09/20 0904  BP: (!) 162/101 (!) 157/97     Subjective Assessment - 03/09/20 0904    Subjective Pt feels much better, running late though.    Currently in Pain? No/denies               PT Education -  03/09/20 1103    Education Details balance, BP    Person(s) Educated Patient    Methods Explanation    Comprehension Verbalized understanding            PT Short Term Goals - 02/24/20 0854      PT SHORT TERM GOAL #1   Title Pt will be I with basic HEP for strength, mobility    Baseline pt is independent and compliant    Time 4    Period Weeks    Status Achieved      PT SHORT TERM GOAL #2   Title FOTO results will be reviewed and info related to goals    Baseline reviewed on 02/24/20    Time 4    Period Weeks    Status Achieved      PT SHORT TERM GOAL #3   Title Balance screening will be completed and goal set for reducing fall risk.    Baseline BERG 42/56, need to set goal    Time 4    Period Weeks    Status Partially Met  PT Long Term Goals - 02/08/20 1142      PT LONG TERM GOAL #1   Title FOTO score will improve to 59% able to more to demo improved functional mobility    Time 8    Period Weeks    Status New    Target Date 04/04/20      PT LONG TERM GOAL #2   Title balance goal TBA    Time 8    Period Weeks    Status New    Target Date 04/04/20      PT LONG TERM GOAL #3   Title Pt will be I with more advanced HEP for LE strength, balance    Time 8    Period Weeks    Status New    Target Date 04/04/20      PT LONG TERM GOAL #4   Title Patient will be able to walk dog using walking stick safely for 30 min at least 3 x per week.    Time 8    Period Weeks    Status New    Target Date 04/04/20      PT LONG TERM GOAL #5   Title Pt will perform floor transfer with Mod I for fall recovery    Time 8    Period Weeks    Status New    Target Date 04/04/20                 Plan - 03/09/20 1104    Clinical Impression Statement Shortended session due to late check in.  BP today now in the hypertensive range, able to reduce to high normal and proceed with standing balance exercises.  Worked on balance reactions with head turns and  overhead reaching, needs min A at times for balance recovery even in parallel bars.    PT Treatment/Interventions ADLs/Self Care Home Management;Gait training;Therapeutic exercise;Patient/family education;Neuromuscular re-education;Balance training;Therapeutic activities;DME Instruction;Moist Heat;Traction;Functional mobility training;Stair training;Cryotherapy;Manual techniques    PT Next Visit Plan monitor BP. balance (parallel bars for visual A )  progress PRN, NuStep, progress hip/general LE strengthening, continue balance training    PT Home Exercise Plan [id number]    Consulted and Agree with Plan of Care Patient           Patient will benefit from skilled therapeutic intervention in order to improve the following deficits and impairments:  Abnormal gait,Decreased balance,Decreased endurance,Decreased mobility,Difficulty walking,Obesity,Decreased range of motion,Decreased activity tolerance,Decreased strength,Increased fascial restricitons,Impaired flexibility,Impaired UE functional use,Postural dysfunction,Pain  Visit Diagnosis: Repeated falls  Abnormal posture  Pain in right hip  Muscle weakness (generalized)  Stiffness of right hip, not elsewhere classified     Problem List Patient Active Problem List   Diagnosis Date Noted  . DOE (dyspnea on exertion) 02/04/2020  . PAF (paroxysmal atrial fibrillation) ([place]) 02/04/2020  . Obesity, diabetes, and hypertension syndrome ([place]) 02/04/2020  . Mixed hyperlipidemia 02/04/2020    Jayanth Szczesniak 03/09/2020, 12:36 PM  [facility] [address] [place], [place], [address] Phone: [phone]   Fax:  [phone]  Name: Bradley Ballard MRN: [id number] Date of Birth: 16-Sep-1950  Raeford Razor, PT 03/09/20 12:36 PM Phone: [phone] Fax: [phone]

## 2020-03-12 ENCOUNTER — Other Ambulatory Visit: Payer: Self-pay

## 2020-03-12 ENCOUNTER — Ambulatory Visit (INDEPENDENT_AMBULATORY_CARE_PROVIDER_SITE_OTHER): Payer: Medicare Other | Admitting: Neurology

## 2020-03-12 DIAGNOSIS — Z8679 Personal history of other diseases of the circulatory system: Secondary | ICD-10-CM

## 2020-03-12 DIAGNOSIS — G4719 Other hypersomnia: Secondary | ICD-10-CM

## 2020-03-12 DIAGNOSIS — R9431 Abnormal electrocardiogram [ECG] [EKG]: Secondary | ICD-10-CM

## 2020-03-12 DIAGNOSIS — I499 Cardiac arrhythmia, unspecified: Secondary | ICD-10-CM

## 2020-03-12 DIAGNOSIS — R351 Nocturia: Secondary | ICD-10-CM

## 2020-03-12 DIAGNOSIS — G4761 Periodic limb movement disorder: Secondary | ICD-10-CM

## 2020-03-12 DIAGNOSIS — G4733 Obstructive sleep apnea (adult) (pediatric): Secondary | ICD-10-CM

## 2020-03-12 DIAGNOSIS — G4720 Circadian rhythm sleep disorder, unspecified type: Secondary | ICD-10-CM

## 2020-03-12 DIAGNOSIS — E669 Obesity, unspecified: Secondary | ICD-10-CM

## 2020-03-14 ENCOUNTER — Other Ambulatory Visit: Payer: Self-pay

## 2020-03-14 ENCOUNTER — Ambulatory Visit: Payer: Medicare Other | Admitting: Physical Therapy

## 2020-03-14 ENCOUNTER — Encounter: Payer: Self-pay | Admitting: Physical Therapy

## 2020-03-14 VITALS — BP 157/92

## 2020-03-14 DIAGNOSIS — M6281 Muscle weakness (generalized): Secondary | ICD-10-CM

## 2020-03-14 DIAGNOSIS — M25551 Pain in right hip: Secondary | ICD-10-CM

## 2020-03-14 DIAGNOSIS — R293 Abnormal posture: Secondary | ICD-10-CM

## 2020-03-14 DIAGNOSIS — R296 Repeated falls: Secondary | ICD-10-CM

## 2020-03-14 DIAGNOSIS — M25651 Stiffness of right hip, not elsewhere classified: Secondary | ICD-10-CM

## 2020-03-14 NOTE — Therapy (Signed)
[place], [place], [address] Phone: [phone]   Fax:  [phone]  Physical Therapy Treatment  Patient Details  Name: Bradley Ballard MRN: [id number] Date of Birth: 70/24/1952 Referring Provider (PT): Dr. Marton Redwood   Encounter Date: 03/14/2020   PT End of Session - 03/14/20 0842    Visit Number 9    Number of Visits 16    Date for PT Re-Evaluation 04/04/20    Authorization Type MCR    PT Start Time 0840   pt late   PT Stop Time 0920    PT Time Calculation (min) 40 min    Activity Tolerance Patient tolerated treatment well    Behavior During Therapy [facility] for tasks assessed/performed           Past Medical History:  Diagnosis Date  . Anxiety   . Arthritis   . BPH (benign prostatic hyperplasia)   . Cerebral palsy (HCC)    Right Leg Issues   . Depression   . Diabetes mellitus without complication ([place])   . Dyspnea    exertion  . Dysrhythmia 2010  . GERD (gastroesophageal reflux disease)    Past - not often   . H/O Bell's palsy   . History of atrial fibrillation    s/p ablation   . Hyperlipidemia   . Hypertension   . Neuromuscular disorder ([place])    peripheral neuropathy     Past Surgical History:  Procedure Laterality Date  . APPENDECTOMY     age 70  . ATRIAL FIBRILLATION ABLATION    . CARDIAC CATHETERIZATION  2013  . COLONOSCOPY     ~15 yrs ago   . POLYPECTOMY     ~15 yrs ago per pt had polyps x 3   . TRANSURETHRAL RESECTION OF PROSTATE N/A 05/11/2019   Procedure: TRANSURETHRAL RESECTION OF THE PROSTATE (TURP);  Surgeon: Robley Fries, MD;  Location: [facility];  Service: Urology;  Laterality: N/A;  2 HRS  . UPPER GASTROINTESTINAL ENDOSCOPY      Vitals:   03/14/20 0848 03/14/20 0851 03/14/20 0918  BP: (!) 166/97 (!) 154/97 (!) 157/92     Subjective Assessment - 03/14/20 0841    Subjective Had a sleep study the other night.  Rushing around.  Min pain in Rt hip . I think my  balance is better I walk without a stick more.    Currently in Pain? No/denies   not when sitting             [facility] Adult PT Treatment/Exercise - 03/14/20 0001      High Level Balance   High Level Balance Comments foam pad Airex with little to no UE assist.  Static on foam x 1 min, head turns with 1 UE assist.  high knees  on airex 1 UE cues for lateral trunk lean.      Knee/Hip Exercises: Stretches   Active Hamstring Stretch Right;3 reps;20 seconds    ITB Stretch 3 reps;20 seconds    ITB Stretch Limitations standing tandem with Rt hip press out    Piriformis Stretch 1 rep;60 seconds      Knee/Hip Exercises: Aerobic   Nustep 6 min LE only L5      Knee/Hip Exercises: Standing   Hip Extension Both;2 sets    Extension Limitations x 10 leaning over high mat table for safety anf increased ROM  PT Education - 03/14/20 0924    Education Details BP, hip pain and stretching    Person(s) Educated Patient    Methods Explanation;Demonstration    Comprehension Verbalized understanding;Returned demonstration            PT Short Term Goals - 02/24/20 0854      PT SHORT TERM GOAL #1   Title Pt will be I with basic HEP for strength, mobility    Baseline pt is independent and compliant    Time 4    Period Weeks    Status Achieved      PT SHORT TERM GOAL #2   Title FOTO results will be reviewed and info related to goals    Baseline reviewed on 02/24/20    Time 4    Period Weeks    Status Achieved      PT SHORT TERM GOAL #3   Title Balance screening will be completed and goal set for reducing fall risk.    Baseline BERG 42/56, need to set goal    Time 4    Period Weeks    Status Partially Met             PT Long Term Goals - 03/14/20 0850      PT LONG TERM GOAL #1   Title FOTO score will improve to 59% able to more to demo improved functional mobility      PT LONG TERM GOAL #2   Title Berg Balance score will improve to 48/56 , indication of  improved balance and reduced fall risk    Baseline 42/56    Status On-going      PT LONG TERM GOAL #3   Title Pt will be I with more advanced HEP for LE strength, balance    Status On-going      PT LONG TERM GOAL #4   Title Patient will be able to walk dog using walking stick safely for 30 min at least 3 x per week.    Baseline 15-20 min    Status On-going      PT LONG TERM GOAL #5   Title Pt will perform floor transfer with Mod I for fall recovery    Status On-going                 Plan - 03/14/20 0913    Clinical Impression Statement Pt continues with hypertensive readings during PT.  No symptoms but takes his meds after session.  States he usually runs 130's /70's.  Sees cardiologist in a couple weeks.  Worked on balance and strengthening in standing with contact guard for safety.  Rt LE weakness and discomfort in Rt ant lateral aspect, increases to 6/10 with standing. Goals in progress.    PT Treatment/Interventions ADLs/Self Care Home Management;Gait training;Therapeutic exercise;Patient/family education;Neuromuscular re-education;Balance training;Therapeutic activities;DME Instruction;Moist Heat;Traction;Functional mobility training;Stair training;Cryotherapy;Manual techniques    PT Next Visit Plan GIVEn standing ther ex for hip.  monitor BP. balance (parallel bars for visual A )  progress PRN, NuStep, progress hip/general LE strengthening, continue balance training    PT Home Exercise Plan [id number]    Consulted and Agree with Plan of Care Patient           Patient will benefit from skilled therapeutic intervention in order to improve the following deficits and impairments:  Abnormal gait,Decreased balance,Decreased endurance,Decreased mobility,Difficulty walking,Obesity,Decreased range of motion,Decreased activity tolerance,Decreased strength,Increased fascial restricitons,Impaired flexibility,Impaired UE functional use,Postural dysfunction,Pain  Visit  Diagnosis: Repeated falls  Abnormal posture  Pain in right hip  Muscle weakness (generalized)  Stiffness of right hip, not elsewhere classified     Problem List Patient Active Problem List   Diagnosis Date Noted  . DOE (dyspnea on exertion) 02/04/2020  . PAF (paroxysmal atrial fibrillation) ([place]) 02/04/2020  . Obesity, diabetes, and hypertension syndrome ([place]) 02/04/2020  . Mixed hyperlipidemia 02/04/2020    Haily Caley 03/14/2020, 9:30 [place], [place], [address] Phone: [phone]   Fax:  [phone]  Name: HIRAM MCIVER MRN: [id number] Date of Birth: 1950/05/09  Raeford Razor, PT 03/14/20 9:30 AM Phone: [phone] Fax: [phone]

## 2020-03-16 ENCOUNTER — Ambulatory Visit: Payer: Medicare Other | Admitting: Physical Therapy

## 2020-03-16 ENCOUNTER — Other Ambulatory Visit: Payer: Self-pay

## 2020-03-16 ENCOUNTER — Encounter: Payer: Self-pay | Admitting: Physical Therapy

## 2020-03-16 DIAGNOSIS — R296 Repeated falls: Secondary | ICD-10-CM

## 2020-03-16 DIAGNOSIS — M25651 Stiffness of right hip, not elsewhere classified: Secondary | ICD-10-CM

## 2020-03-16 DIAGNOSIS — M25551 Pain in right hip: Secondary | ICD-10-CM

## 2020-03-16 DIAGNOSIS — R293 Abnormal posture: Secondary | ICD-10-CM

## 2020-03-16 DIAGNOSIS — M6281 Muscle weakness (generalized): Secondary | ICD-10-CM

## 2020-03-16 NOTE — Therapy (Addendum)
[place], [place], [address] Phone: [phone]   Fax:  [phone]  Physical Therapy Treatment  Patient Details  Name: Bradley Ballard MRN: [id number] Date of Birth: [birth year] Referring Provider (PT): Dr. Marton Redwood    Progress Note Reporting Period 02/08/20 to 03/21/20  See note below for Objective Data and Assessment of Progress/Goals.      Encounter Date: 03/16/2020   PT End of Session - 03/16/20 0919    Visit Number 10    Number of Visits 16    Date for PT Re-Evaluation 04/04/20    Authorization Type MCR    PT Start Time 0834    PT Stop Time 0920    PT Time Calculation (min) 46 min    Activity Tolerance Patient tolerated treatment well    Behavior During Therapy [facility] for tasks assessed/performed           Past Medical History:  Diagnosis Date  . Anxiety   . Arthritis   . BPH (benign prostatic hyperplasia)   . Cerebral palsy (HCC)    Right Leg Issues   . Depression   . Diabetes mellitus without complication ([place])   . Dyspnea    exertion  . Dysrhythmia 2010  . GERD (gastroesophageal reflux disease)    Past - not often   . H/O Bell's palsy   . History of atrial fibrillation    s/p ablation   . Hyperlipidemia   . Hypertension   . Neuromuscular disorder ([place])    peripheral neuropathy     Past Surgical History:  Procedure Laterality Date  . APPENDECTOMY     age 39  . ATRIAL FIBRILLATION ABLATION    . CARDIAC CATHETERIZATION  2013  . COLONOSCOPY     ~15 yrs ago   . POLYPECTOMY     ~15 yrs ago per pt had polyps x 3   . TRANSURETHRAL RESECTION OF PROSTATE N/A 05/11/2019   Procedure: TRANSURETHRAL RESECTION OF THE PROSTATE (TURP);  Surgeon: Robley Fries, MD;  [facility];  Service: Urology;  Laterality: N/A;  2 HRS  . UPPER GASTROINTESTINAL ENDOSCOPY      There were no vitals filed for this visit.   Subjective Assessment - 03/16/20 0838    Subjective A little  stiffness in Rt hip today.  Yesterday was a good day. Everything is improving.  Neck hurts though.    Currently in Pain? No/denies              [facility] Adult PT Treatment/Exercise - 03/16/20 0001      Lumbar Exercises: Stretches   Active Hamstring Stretch 3 reps    Active Hamstring Stretch Limitations seated and supine    Lower Trunk Rotation 10 seconds    Lower Trunk Rotation Limitations x 10    Pelvic Tilt 10 reps    Figure 4 Stretch Limitations x 30 sec      Lumbar Exercises: Supine   Bridge with Ball Squeeze 10 reps      Knee/Hip Exercises: Aerobic   Nustep 5 min L5 UE and LE      Knee/Hip Exercises: Seated   Sit to Sand 15 reps;without UE support   2 sets, 1 with 10 lbs                   PT Short Term Goals - 02/24/20 0854      PT SHORT TERM GOAL #1   Title Pt will be I  with basic HEP for strength, mobility    Baseline pt is independent and compliant    Time 4    Period Weeks    Status Achieved      PT SHORT TERM GOAL #2   Title FOTO results will be reviewed and info related to goals    Baseline reviewed on 02/24/20    Time 4    Period Weeks    Status Achieved      PT SHORT TERM GOAL #3   Title Balance screening will be completed and goal set for reducing fall risk.    Baseline BERG 42/56, need to set goal    Time 4    Period Weeks    Status Partially Met             PT Long Term Goals - 03/14/20 0850      PT LONG TERM GOAL #1   Title FOTO score will improve to 59% able to more to demo improved functional mobility      PT LONG TERM GOAL #2   Title Berg Balance score will improve to 48/56 , indication of improved balance and reduced fall risk    Baseline 42/56    Status On-going      PT LONG TERM GOAL #3   Title Pt will be I with more advanced HEP for LE strength, balance    Status On-going      PT LONG TERM GOAL #4   Title Patient will be able to walk dog using walking stick safely for 30 min at least 3 x per week.    Baseline 15-20  min    Status On-going      PT LONG TERM GOAL #5   Title Pt will perform floor transfer with Mod I for fall recovery    Status On-going                 Plan - 03/16/20 0840    Clinical Impression Statement Worked on mat for stretching for reinforcement of HEP and mobility. Able to increase load with sit to stand and maintain good HR response,  SaO2. stable with exercises.    PT Treatment/Interventions ADLs/Self Care Home Management;Gait training;Therapeutic exercise;Patient/family education;Neuromuscular re-education;Balance training;Therapeutic activities;DME Instruction;Moist Heat;Traction;Functional mobility training;Stair training;Cryotherapy;Manual techniques    PT Next Visit Plan GIVEn standing ther ex for hip.  monitor BP. balance (parallel bars for visual A )  progress PRN, NuStep, progress hip/general LE strengthening, continue balance training    PT Home Exercise Plan [id number]    Consulted and Agree with Plan of Care Patient           Patient will benefit from skilled therapeutic intervention in order to improve the following deficits and impairments:  Abnormal gait,Decreased balance,Decreased endurance,Decreased mobility,Difficulty walking,Obesity,Decreased range of motion,Decreased activity tolerance,Decreased strength,Increased fascial restricitons,Impaired flexibility,Impaired UE functional use,Postural dysfunction,Pain  Visit Diagnosis: Repeated falls  Abnormal posture  Pain in right hip  Muscle weakness (generalized)  Stiffness of right hip, not elsewhere classified     Problem List Patient Active Problem List   Diagnosis Date Noted  . DOE (dyspnea on exertion) 02/04/2020  . PAF (paroxysmal atrial fibrillation) ([place]) 02/04/2020  . Obesity, diabetes, and hypertension syndrome ([place]) 02/04/2020  . Mixed hyperlipidemia 02/04/2020    Syna Gad 03/16/2020, 9:30 AM  [place], [place], [address] Phone: [phone]   Fax:  [phone]  Name: Bradley Ballard MRN: [id number] Date of Birth: 10-Aug-1950  Raeford Razor, PT 03/16/20 9:30 AM Phone: [phone] Fax: [phone]

## 2020-03-17 ENCOUNTER — Telehealth: Payer: Self-pay

## 2020-03-17 NOTE — Telephone Encounter (Signed)
Voicemail left with results of heart monitor and Dr. Izora Ribas recommendation for a stress test if he is continuing with CP and DOE.  Told to call the office if he has any questions or concerns.

## 2020-03-17 NOTE — Telephone Encounter (Signed)
-----   Message from Christell Constant, MD sent at 03/16/2020  1:01 PM EST ----- Results: No return of AF Plan: Still having CP and DOE; would recommend stress test as we discussed 02/04/20  Christell Constant, MD

## 2020-03-21 ENCOUNTER — Ambulatory Visit: Payer: Medicare Other | Admitting: Physical Therapy

## 2020-03-21 ENCOUNTER — Other Ambulatory Visit: Payer: Self-pay

## 2020-03-21 ENCOUNTER — Encounter: Payer: Self-pay | Admitting: Physical Therapy

## 2020-03-21 DIAGNOSIS — R293 Abnormal posture: Secondary | ICD-10-CM

## 2020-03-21 DIAGNOSIS — M6281 Muscle weakness (generalized): Secondary | ICD-10-CM

## 2020-03-21 DIAGNOSIS — R296 Repeated falls: Secondary | ICD-10-CM

## 2020-03-21 DIAGNOSIS — M25651 Stiffness of right hip, not elsewhere classified: Secondary | ICD-10-CM

## 2020-03-21 DIAGNOSIS — M25551 Pain in right hip: Secondary | ICD-10-CM

## 2020-03-21 NOTE — Therapy (Signed)
[place], [place], [address] Phone: [phone]   Fax:  [phone]  Physical Therapy Treatment  Patient Details  Name: Bradley Ballard MRN: [id number] Date of Birth: 04/10/1950 Referring Provider (PT): Dr. Marton Ballard   Encounter Date: 03/21/2020   PT End of Session - 03/21/20 1104    Visit Number 11    Number of Visits 16    Date for PT Re-Evaluation 04/04/20    Authorization Type MCR    PT Start Time 1049    PT Stop Time 1130    PT Time Calculation (min) 41 min           Past Medical History:  Diagnosis Date  . Anxiety   . Arthritis   . BPH (benign prostatic hyperplasia)   . Cerebral palsy (HCC)    Right Leg Issues   . Depression   . Diabetes mellitus without complication ([place])   . Dyspnea    exertion  . Dysrhythmia 2010  . GERD (gastroesophageal reflux disease)    Past - not often   . H/O Bell's palsy   . History of atrial fibrillation    s/p ablation   . Hyperlipidemia   . Hypertension   . Neuromuscular disorder ([place])    peripheral neuropathy     Past Surgical History:  Procedure Laterality Date  . APPENDECTOMY     age 36  . ATRIAL FIBRILLATION ABLATION    . CARDIAC CATHETERIZATION  2013  . COLONOSCOPY     ~15 yrs ago   . POLYPECTOMY     ~15 yrs ago per pt had polyps x 3   . TRANSURETHRAL RESECTION OF PROSTATE N/A 05/11/2019   Procedure: TRANSURETHRAL RESECTION OF THE PROSTATE (TURP);  Surgeon: Bradley Fries, MD;  [facility];  Service: Urology;  Laterality: N/A;  2 HRS  . UPPER GASTROINTESTINAL ENDOSCOPY      There were no vitals filed for this visit.   Subjective Assessment - 03/21/20 1101    Subjective Fell last week.   I think my pain is getting better.    Currently in Pain? Yes    Pain Score --   tightness   Pain Location Hip    Pain Orientation Right    Pain Descriptors / Indicators Tightness    Pain Type Chronic pain    Pain Onset More than a month  ago    Pain Frequency Constant               [facility] Adult PT Treatment/Exercise - 03/21/20 0001      High Level Balance   High Level Balance Activities Side stepping;Negotiating over obstacles    High Level Balance Comments gait belt and close S, min A at times   forward and lateral stepping     Knee/Hip Exercises: Aerobic   Nustep 8 min L6 UE and LE mod to quick pace      Knee/Hip Exercises: Standing   Other Standing Knee Exercises stepping with 2 walking poles forward, back and each LE x 10.  Min A to avoid falling.  Pelvis rotates a great deal with stepping with LLE (weak Rt LE)      Ankle Exercises: Stretches   Slant Board Stretch 5 reps;30 seconds                    PT Short Term Goals - 02/24/20 0854      PT SHORT TERM GOAL #1  Title Pt will be I with basic HEP for strength, mobility    Baseline pt is independent and compliant    Time 4    Period Weeks    Status Achieved      PT SHORT TERM GOAL #2   Title FOTO results will be reviewed and info related to goals    Baseline reviewed on 02/24/20    Time 4    Period Weeks    Status Achieved      PT SHORT TERM GOAL #3   Title Balance screening will be completed and goal set for reducing fall risk.    Baseline BERG 42/56, need to set goal    Time 4    Period Weeks    Status Partially Met             PT Long Term Goals - 03/14/20 0850      PT LONG TERM GOAL #1   Title FOTO score will improve to 59% able to more to demo improved functional mobility      PT LONG TERM GOAL #2   Title Berg Balance score will improve to 48/56 , indication of improved balance and reduced fall risk    Baseline 42/56    Status On-going      PT LONG TERM GOAL #3   Title Pt will be I with more advanced HEP for LE strength, balance    Status On-going      PT LONG TERM GOAL #4   Title Patient will be able to walk dog using walking stick safely for 30 min at least 3 x per week.    Baseline 15-20 min    Status On-going       PT LONG TERM GOAL #5   Title Pt will perform floor transfer with Mod I for fall recovery    Status On-going                 Plan - 03/21/20 1450    Clinical Impression Statement Patient has difficulty with stepping backward and lateral step overs with gait and balance activities.  He needed min A at times to keep from falling.  Needs cues to slow down his pace and move intentionally. Calf stretching beneficil as Rt heel stays off the ground with gait, making it hard to avoid leaning forward with balance.  Not limited by pain today.    PT Treatment/Interventions ADLs/Self Care Home Management;Gait training;Therapeutic exercise;Patient/family education;Neuromuscular re-education;Balance training;Therapeutic activities;DME Instruction;Moist Heat;Traction;Functional mobility training;Stair training;Cryotherapy;Manual techniques    PT Next Visit Plan check standing ther ex for hip, balance. more Airex balance, perturbations , stretch hip prn    PT Home Exercise Plan [id number]    Consulted and Agree with Plan of Care Patient           Patient will benefit from skilled therapeutic intervention in order to improve the following deficits and impairments:  Abnormal gait,Decreased balance,Decreased endurance,Decreased mobility,Difficulty walking,Obesity,Decreased range of motion,Decreased activity tolerance,Decreased strength,Increased fascial restricitons,Impaired flexibility,Impaired UE functional use,Postural dysfunction,Pain  Visit Diagnosis: Repeated falls  Abnormal posture  Pain in right hip  Muscle weakness (generalized)  Stiffness of right hip, not elsewhere classified     Problem List Patient Active Problem List   Diagnosis Date Noted  . DOE (dyspnea on exertion) 02/04/2020  . PAF (paroxysmal atrial fibrillation) ([place]) 02/04/2020  . Obesity, diabetes, and hypertension syndrome ([place]) 02/04/2020  . Mixed hyperlipidemia 02/04/2020    Bradley Ballard 03/21/2020, 2:55  PM  [facility]  [place], [place], [address] Phone: [phone]   Fax:  [phone]  Name: Bradley Ballard MRN: [id number] Date of Birth: May 21, 1950   Bradley Ballard, PT 03/21/20 2:55 PM Phone: [phone] Fax: [phone]

## 2020-03-23 ENCOUNTER — Telehealth (HOSPITAL_COMMUNITY): Payer: Self-pay | Admitting: *Deleted

## 2020-03-23 NOTE — Telephone Encounter (Signed)
Patient given detailed instructions per Myocardial Perfusion Study Information Sheet for the test on 03/27/20. Patient notified to arrive 15 minutes early and that it is imperative to arrive on time for appointment to keep from having the test rescheduled.  If you need to cancel or reschedule your appointment, please call the office within 24 hours of your appointment. . Patient verbalized understanding. Meiling Hendriks Jacqueline    

## 2020-03-24 ENCOUNTER — Ambulatory Visit: Payer: Medicare Other | Admitting: Physical Therapy

## 2020-03-24 ENCOUNTER — Encounter: Payer: Self-pay | Admitting: Physical Therapy

## 2020-03-24 ENCOUNTER — Other Ambulatory Visit: Payer: Self-pay

## 2020-03-24 DIAGNOSIS — R293 Abnormal posture: Secondary | ICD-10-CM

## 2020-03-24 DIAGNOSIS — M6281 Muscle weakness (generalized): Secondary | ICD-10-CM

## 2020-03-24 DIAGNOSIS — M25551 Pain in right hip: Secondary | ICD-10-CM

## 2020-03-24 DIAGNOSIS — R296 Repeated falls: Secondary | ICD-10-CM

## 2020-03-24 DIAGNOSIS — M25651 Stiffness of right hip, not elsewhere classified: Secondary | ICD-10-CM

## 2020-03-24 NOTE — Therapy (Addendum)
[place], [place], [address] Phone: [phone]   Fax:  [phone]  Physical Therapy Treatment/Discharge  Patient Details  Name: Bradley Ballard MRN: [id number] Date of Birth: 18-Oct-1950 Referring Provider (PT): Dr. Marton Redwood   Encounter Date: 03/24/2020   PT End of Session - 03/24/20 0841    Visit Number 12    Number of Visits 16    Date for PT Re-Evaluation 04/04/20    Authorization Type MCR    PT Start Time 0830    PT Stop Time 0915    PT Time Calculation (min) 45 min           Past Medical History:  Diagnosis Date  . Anxiety   . Arthritis   . BPH (benign prostatic hyperplasia)   . Cerebral palsy (HCC)    Right Leg Issues   . Depression   . Diabetes mellitus without complication ([place])   . Dyspnea    exertion  . Dysrhythmia 2010  . GERD (gastroesophageal reflux disease)    Past - not often   . H/O Bell's palsy   . History of atrial fibrillation    s/p ablation   . Hyperlipidemia   . Hypertension   . Neuromuscular disorder ([place])    peripheral neuropathy     Past Surgical History:  Procedure Laterality Date  . APPENDECTOMY     age 68  . ATRIAL FIBRILLATION ABLATION    . CARDIAC CATHETERIZATION  2013  . COLONOSCOPY     ~15 yrs ago   . POLYPECTOMY     ~15 yrs ago per pt had polyps x 3   . TRANSURETHRAL RESECTION OF PROSTATE N/A 05/11/2019   Procedure: TRANSURETHRAL RESECTION OF THE PROSTATE (TURP);  Surgeon: Robley Fries, MD;  Location: [facility];  Service: Urology;  Laterality: N/A;  2 HRS  . UPPER GASTROINTESTINAL ENDOSCOPY      There were no vitals filed for this visit.   Subjective Assessment - 03/24/20 0832    Subjective I have a pinched nerve in my neck and I can feel it this morning.    Pain Score 4     Pain Location Hip    Pain Orientation Right    Pain Descriptors / Indicators Sore    Pain Type Chronic pain    Pain Radiating Towards tightness in hamstring     Aggravating Factors  standing and walking    Pain Relieving Factors laying down              [facility] PT Assessment - 03/24/20 0001      Observation/Other Assessments   Focus on Therapeutic Outcomes (FOTO)  57%   Improved from 50%   Other Surveys  --   SPARE-FA 58.4 (improved from 61     Strength   Right Hip Flexion 4+/5    Left Hip Flexion 5/5      Standardized Balance Assessment   Standardized Balance Assessment Five Times Sit to Stand    Five times sit to stand comments  16.2 sec without UE                         [facility] Adult PT Treatment/Exercise - 03/24/20 0001      Neuro Re-ed    Neuro Re-ed Details  Rocker board A/P and lateral weight shifting Eyes open and eyes closed, needs min UE assist to prevent LOB      Lumbar Exercises:  Supine   Bridge with [employer] 10 reps      Knee/Hip Exercises: Stretches   Active Hamstring Stretch Right;3 reps;20 seconds    Piriformis Stretch 1 rep;60 seconds    Piriformis Stretch Limitations supine    Gastroc Stretch Limitations runners stretch standing      Knee/Hip Exercises: Seated   Sit to [employer] 15 reps      Knee/Hip Exercises: Sidelying   Hip ABduction Right;Left;15 reps    Clams x 15 each                    PT Short Term Goals - 03/24/20 0927      PT SHORT TERM GOAL #1   Title Pt will be I with basic HEP for strength, mobility    Baseline pt is independent and compliant    Time 4    Status Achieved    Target Date 03/07/20      PT SHORT TERM GOAL #2   Title FOTO results will be reviewed and info related to goals    Baseline reviewed on 02/24/20    Time 4    Period Weeks    Status Achieved      PT SHORT TERM GOAL #3   Title Balance screening will be completed and goal set for reducing fall risk.    Baseline BERG 42/56    Time 4    Period Weeks    Status Achieved    Target Date 03/07/20             PT Long Term Goals - 03/24/20 0927      PT LONG TERM GOAL #1   Title FOTO score  will improve to 59% able to more to demo improved functional mobility    Baseline improved to 57%    Time 8    Period Weeks    Status On-going      PT LONG TERM GOAL #2   Title Berg Balance score will improve to 48/56 , indication of improved balance and reduced fall risk    Baseline 42/56    Time 8    Period Weeks    Status On-going      PT LONG TERM GOAL #3   Title Pt will be I with more advanced HEP for LE strength, balance    Time 8    Period Weeks    Status On-going      PT LONG TERM GOAL #4   Title Patient will be able to walk dog using walking stick safely for 30 min at least 3 x per week.    Time 8    Period Weeks    Status Unable to assess      PT LONG TERM GOAL #5   Title Pt will perform floor transfer with Mod I for fall recovery    Time 8    Period Weeks    Status Unable to assess                 Plan - 03/24/20 6269    Clinical Impression Statement Pt reports a decrease in RLE shooting pains. He also reports he is able to put his heel down more. He rates his hip pain as 6/10 but reports overall his pain has improved. Began rockerboard weight shifting and he tolerated this well but was unable to perform without min UE assist.  His Hip flexion MMT has improved on the right. His FOTO score has improved from 50%  to 57%. After session he reported feeling looser in RLE.    Comorbidities cerebral palsy with chronic tightness/tone, obesity, back pain, others/see snapshot    PT Treatment/Interventions ADLs/Self Care Home Management;Gait training;Therapeutic exercise;Patient/family education;Neuromuscular re-education;Balance training;Therapeutic activities;DME Instruction;Moist Heat;Traction;Functional mobility training;Stair training;Cryotherapy;Manual techniques    PT Next Visit Plan recheck BERG, , floor transfer,  begin discussion about DC vs renewal    PT Home Exercise Plan [id number]           Patient will benefit from skilled therapeutic intervention in  order to improve the following deficits and impairments:  Abnormal gait,Decreased balance,Decreased endurance,Decreased mobility,Difficulty walking,Obesity,Decreased range of motion,Decreased activity tolerance,Decreased strength,Increased fascial restricitons,Impaired flexibility,Impaired UE functional use,Postural dysfunction,Pain  Visit Diagnosis: Repeated falls  Abnormal posture  Pain in right hip  Muscle weakness (generalized)  Stiffness of right hip, not elsewhere classified     Problem List Patient Active Problem List   Diagnosis Date Noted  . DOE (dyspnea on exertion) 02/04/2020  . PAF (paroxysmal atrial fibrillation) ([place]) 02/04/2020  . Obesity, diabetes, and hypertension syndrome ([place]) 02/04/2020  . Mixed hyperlipidemia 02/04/2020    Dorene Ar , PTA 03/24/2020, 9:30 AM  [facility] [address] [place], [place], [address] Phone: [phone]   Fax:  [phone]  Name: Bradley Ballard MRN: [id number] Date of Birth: [birth year]  PHYSICAL THERAPY DISCHARGE SUMMARY  Visits from Start of Care: 12  Current functional level related to goals / functional outcomes: See above for most recent.  Did not return to PT    Remaining deficits: Fall, balance , hip strength    Education / Equipment: HEP, balance, cane for safety.   Plan: Patient agrees to discharge.  Patient goals were partially met. Patient is being discharged due to not returning since the last visit.  ?????    Raeford Razor, PT 05/15/20 1:59 PM Phone: [phone] Fax: [phone]

## 2020-03-27 ENCOUNTER — Telehealth: Payer: Self-pay

## 2020-03-27 ENCOUNTER — Ambulatory Visit (HOSPITAL_COMMUNITY): Payer: Medicare Other | Attending: Internal Medicine

## 2020-03-27 ENCOUNTER — Other Ambulatory Visit: Payer: Self-pay

## 2020-03-27 DIAGNOSIS — R0609 Other forms of dyspnea: Secondary | ICD-10-CM

## 2020-03-27 DIAGNOSIS — R0600 Dyspnea, unspecified: Secondary | ICD-10-CM | POA: Insufficient documentation

## 2020-03-27 LAB — MYOCARDIAL PERFUSION IMAGING
LV dias vol: 68 mL (ref 62–150)
LV sys vol: 30 mL
Peak HR: 88 {beats}/min
Rest HR: 76 {beats}/min
SDS: 4
SRS: 0
SSS: 4
TID: 1.07

## 2020-03-27 MED ORDER — TECHNETIUM TC 99M TETROFOSMIN IV KIT
32.1000 | PACK | Freq: Once | INTRAVENOUS | Status: AC | PRN
  Administered 2020-03-27: 32.1 via INTRAVENOUS
  Filled 2020-03-27: qty 33

## 2020-03-27 MED ORDER — TECHNETIUM TC 99M TETROFOSMIN IV KIT
9.3000 | PACK | Freq: Once | INTRAVENOUS | Status: AC | PRN
  Administered 2020-03-27: 9.3 via INTRAVENOUS
  Filled 2020-03-27: qty 10

## 2020-03-27 MED ORDER — REGADENOSON 0.4 MG/5ML IV SOLN
0.4000 mg | Freq: Once | INTRAVENOUS | Status: AC
Start: 2020-03-27 — End: 2020-03-27
  Administered 2020-03-27: 0.4 mg via INTRAVENOUS

## 2020-03-27 NOTE — Progress Notes (Signed)
Patient referred by Dr. Clelia Croft, seen by me on 02/23/20, diagnostic PSG on 03/12/20.   Please call and notify the patient that the recent sleep study showed moderate to severe obstructive sleep apnea - he did not achieve any REM/dream sleep. I recommend treatment for his sleep apnea in the form of CPAP. This will require a repeat sleep study for proper titration and mask fitting and correct monitoring of the oxygen saturations. Please explain to patient. I have placed an order in the chart.  Of note, his EKG was in keeping with A fib - he is followed closely by cardiology.   Thanks.    Huston Foley, MD, PhD [facility])

## 2020-03-27 NOTE — Procedures (Signed)
PATIENT'S NAME:  Bradley Ballard, Bradley Ballard DOB:      03-03-1950      MR#:    [id number]     DATE OF RECORDING: 03/12/2020 REFERRING M.D.:  Carolin Coy, MD Study Performed:   Baseline Polysomnogram HISTORY: 70 year old man with a history of diabetes, neuropathy, history of Bell's palsy, WPW, status post ablation, atrial fibrillation, BPH with status post TURP in April 2021, depression, cerebral palsy, hypertension, hyperlipidemia, reflux disease, and obesity, who was previously diagnosed with obstructive. He has not been on CPAP therapy. The patient endorsed the Epworth Sleepiness Scale at 12 points. The patient's weight 261 pounds with a height of 71 (inches), resulting in a BMI of 36.4 kg/m2. The patient's neck circumference measured 19.5 inches.  CURRENT MEDICATIONS: Aspirin, Lipitor, Wellbutrin XL, Cymbalta, Proscar, Fenofibrate, Flonase, Neurontin, Advil, Glucophage-XR, Percocet, Revation, Flomax, Theratears, Prevident   PROCEDURE:  This is a multichannel digital polysomnogram utilizing the Somnostar 11.2 system.  Electrodes and sensors were applied and monitored per AASM Specifications.   EEG, EOG, Chin and Limb EMG, were sampled at 200 Hz.  ECG, Snore and Nasal Pressure, Thermal Airflow, Respiratory Effort, CPAP Flow and Pressure, Oximetry was sampled at 50 Hz. Digital video and audio were recorded.      BASELINE STUDY  Lights Out was at 00:05 and Lights On at 07:36.  Total recording time (TRT) was 451.5 minutes, with a total sleep time (TST) of 348.5 minutes.   The patient's sleep latency was 6.5 minutes.  REM latency was absent.  The sleep efficiency was 77.2 %.     SLEEP ARCHITECTURE: WASO (Wake after sleep onset) was 96.5 minutes with moderate sleep fragmentation noted.  There were 50.5 minutes in Stage N1, 290.5 minutes Stage N2, 7.5 minutes Stage N3 and 0 minutes in Stage REM.  The percentage of Stage N1 was 14.5%, which is increased, Stage N2 was 83.4%, which is markedly increased, Stage N3 was 2.2%,  and Stage R (REM sleep) was absent. The arousals were noted as: 7 were spontaneous, 18 were associated with PLMs, 10 were associated with respiratory events.  RESPIRATORY ANALYSIS:  There were a total of 128 respiratory events:  8 obstructive apneas, 0 central apneas and 0 mixed apneas with a total of 8 apneas and an apnea index (AI) of 1.4 /hour. There were 120 hypopneas with a hypopnea index of 20.7 /hour. The patient also had 0 respiratory event related arousals (RERAs).      The total APNEA/HYPOPNEA INDEX (AHI) was 22./hour and the total RESPIRATORY DISTURBANCE INDEX was  22. /hour.  0 events occurred in REM sleep and 240 events in NREM. The REM AHI was n/a, versus a non-REM AHI of 22.. The patient spent 93.5 minutes of total sleep time in the supine position and 255 minutes in non-supine. The supine AHI was 43.0 versus a non-supine AHI of 14.4.  OXYGEN SATURATION & C02:  The Wake baseline 02 saturation was 94%, with the lowest being 84%. Time spent below 89% saturation equaled 6 minutes.  PERIODIC LIMB MOVEMENTS: The patient had a total of 993 Periodic Limb Movements.  The Periodic Limb Movement (PLM) index was 171. and the PLM Arousal index was 3.1/hour.  Audio and video analysis did not show any abnormal or unusual movements, behaviors, phonations or vocalizations. The patient took 3 bathroom breaks and ate a snack or drank something after each bathroom break. Moderate to loud snoring was noted. The EKG was irregular throughout the night, likely due to atrial fibrillation.   Post-study,  the patient indicated that sleep was better than usual.   IMPRESSION:  1. Obstructive Sleep Apnea (OSA) 2. Periodic Limb Movement Disorder (PLMD) 3. Dysfunctions associated with sleep stages or arousal from sleep 4. Non-specific abnormal EKG  RECOMMENDATIONS:  1. This study demonstrates moderate to severe obstructive sleep apnea, with a total AHI of 22/hour, supine AHI of 43/hour and O2 nadir of 84%. The  absence of REM sleep likely underestimates his sleep disordered breathing. Treatment with positive airway pressure in the form of CPAP is recommended. This will require a full night titration study to optimize therapy. Other treatment options may include avoidance of supine sleep position along with weight loss, upper airway or jaw surgery in selected patients or the use of an oral appliance in certain patients. ENT evaluation and/or consultation with a maxillofacial surgeon or dentist may be feasible in some instances.    2. Please note that untreated obstructive sleep apnea may carry additional perioperative morbidity. Patients with significant obstructive sleep apnea should receive perioperative PAP therapy and the surgeons and particularly the anesthesiologist should be informed of the diagnosis and the severity of the sleep disordered breathing. 3. Severe PLMs (periodic limb movements of sleep) were noted during this study without significant arousals; clinical correlation is recommended. Medication effect from the antidepressant medication should be considered. PLMs may improve with OSA treatment. 4. This study shows sleep fragmentation and abnormal sleep stage percentages; these are nonspecific findings and per se do not signify an intrinsic sleep disorder or a cause for the patient's sleep-related symptoms. Causes include (but are not limited to) the first night effect of the sleep study, circadian rhythm disturbances, medication effect or an underlying mood disorder or medical problem.  5. The study showed an irregular single lead EKG throughout the night, in keeping with atrial fibrillation; clinical correlation is recommended. 6. The patient will be seen in follow-up in the sleep clinic at [facility] for discussion of the test results, symptom and treatment compliance review, further management strategies, etc. The referring provider will be notified of the test results. I certify that I have reviewed the  entire raw data recording prior to the issuance of this report in accordance with the Standards of Accreditation of the American Academy of Sleep Medicine (AASM)  Huston Foley, MD, PhD Diplomat, American Board of Neurology and Sleep Medicine (Neurology and Sleep Medicine)

## 2020-03-27 NOTE — Telephone Encounter (Signed)
-----   Message from Huston Foley, MD sent at 03/27/2020  9:27 AM EST ----- Patient referred by Dr. Clelia Croft, seen by me on 02/23/20, diagnostic PSG on 03/12/20.   Please call and notify the patient that the recent sleep study showed moderate to severe obstructive sleep apnea - he did not achieve any REM/dream sleep. I recommend treatment for his sleep apnea in the form of CPAP. This will require a repeat sleep study for proper titration and mask fitting and correct monitoring of the oxygen saturations. Please explain to patient. I have placed an order in the chart.  Of note, his EKG was in keeping with A fib - he is followed closely by cardiology.   Thanks.    Huston Foley, MD, PhD [facility])

## 2020-03-27 NOTE — Telephone Encounter (Signed)
I called pt. I advised pt that Dr. Athar reviewed their sleep study results and found that moderate to severe osa was found and recommends that pt be treated with a cpap. Dr. Athar recommends that pt return for a repeat sleep study in order to properly titrate the cpap and ensure a good mask fit. Pt is agreeable to returning for a titration study. I advised pt that our sleep lab will file with pt's insurance and call pt to schedule the sleep study when we hear back from the pt's insurance regarding coverage of this sleep study. Pt verbalized understanding of results. Pt had no questions at this time but was encouraged to call back if questions arise.   

## 2020-03-27 NOTE — Addendum Note (Signed)
Addended by: Huston Foley on: 03/27/2020 09:27 AM   Modules accepted: Orders

## 2020-03-28 ENCOUNTER — Ambulatory Visit: Payer: Medicare Other | Admitting: Physical Therapy

## 2020-03-31 ENCOUNTER — Ambulatory Visit: Payer: Medicare Other | Admitting: Physical Therapy

## 2020-04-03 ENCOUNTER — Ambulatory Visit: Payer: Medicare Other | Admitting: Physical Therapy

## 2020-04-03 ENCOUNTER — Telehealth: Payer: Self-pay

## 2020-04-03 NOTE — Telephone Encounter (Signed)
LVM for pt to call me back to schedule sleep study  

## 2020-04-05 ENCOUNTER — Telehealth: Payer: Self-pay

## 2020-04-05 NOTE — Telephone Encounter (Signed)
LVM for pt to call me back to schedule sleep study  

## 2020-04-06 ENCOUNTER — Ambulatory Visit: Payer: Medicare Other | Admitting: Physical Therapy

## 2020-04-18 ENCOUNTER — Ambulatory Visit (INDEPENDENT_AMBULATORY_CARE_PROVIDER_SITE_OTHER): Payer: Medicare Other | Admitting: Neurology

## 2020-04-18 DIAGNOSIS — I499 Cardiac arrhythmia, unspecified: Secondary | ICD-10-CM

## 2020-04-18 DIAGNOSIS — R9431 Abnormal electrocardiogram [ECG] [EKG]: Secondary | ICD-10-CM

## 2020-04-18 DIAGNOSIS — Z8679 Personal history of other diseases of the circulatory system: Secondary | ICD-10-CM

## 2020-04-18 DIAGNOSIS — G4733 Obstructive sleep apnea (adult) (pediatric): Secondary | ICD-10-CM

## 2020-04-18 DIAGNOSIS — E669 Obesity, unspecified: Secondary | ICD-10-CM

## 2020-04-18 DIAGNOSIS — G4719 Other hypersomnia: Secondary | ICD-10-CM

## 2020-04-18 DIAGNOSIS — G4720 Circadian rhythm sleep disorder, unspecified type: Secondary | ICD-10-CM

## 2020-04-18 DIAGNOSIS — R351 Nocturia: Secondary | ICD-10-CM

## 2020-04-18 DIAGNOSIS — G4761 Periodic limb movement disorder: Secondary | ICD-10-CM

## 2020-04-19 ENCOUNTER — Ambulatory Visit: Payer: Medicare Other | Admitting: Internal Medicine

## 2020-04-19 NOTE — Progress Notes (Deleted)
Cardiology Office Note:    Date:  04/19/2020   ID:  Bradley Ballard, DOB [birth year], MRN [id number]  PCP:  Cleatis Polka., MD  [facility] Cardiologist:  Christell Constant, MD  [facility] Electrophysiologist:  None   CC: DOE  History of Present Illness:    Bradley Ballard is a 70 y.o. male with a hx of Atrial Fibrillation s/ Ablation in [place] ~ 2010, Morbid Obesity DM with HTN, HLD, OSA, Chart Review history of WPW last seen 01/2020  In interim of this visit, patient normal BNP and negative stress test.  Patient notes that he is doing ***.  Since day prior/last visit notes *** changes.  Relevant interval testing or therapy include ***.  There are no*** interval hospital/ED visit.    No chest pain or pressure ***.  No SOB/DOE*** and no PND/Orthopnea***.  No weight gain or leg swelling***.  No palpitations or syncope ***.  Ambulatory blood pressure ***.    Past Medical History:  Diagnosis Date  . Anxiety   . Arthritis   . BPH (benign prostatic hyperplasia)   . Cerebral palsy (HCC)    Right Leg Issues   . Depression   . Diabetes mellitus without complication (HCC)   . Dyspnea    exertion  . Dysrhythmia 2010  . GERD (gastroesophageal reflux disease)    Past - not often   . H/O Bell's palsy   . History of atrial fibrillation    s/p ablation   . Hyperlipidemia   . Hypertension   . Neuromuscular disorder (HCC)    peripheral neuropathy     Past Surgical History:  Procedure Laterality Date  . APPENDECTOMY     age 40  . ATRIAL FIBRILLATION ABLATION    . CARDIAC CATHETERIZATION  2013  . COLONOSCOPY     ~15 yrs ago   . POLYPECTOMY     ~15 yrs ago per pt had polyps x 3   . TRANSURETHRAL RESECTION OF PROSTATE N/A 05/11/2019   Procedure: TRANSURETHRAL RESECTION OF THE PROSTATE (TURP);  Surgeon: Noel Christmas, MD;  Location: [facility];  Service: Urology;  Laterality: N/A;  2 HRS  . UPPER GASTROINTESTINAL ENDOSCOPY      Current Medications: No  outpatient medications have been marked as taking for the 04/19/20 encounter (Appointment) with Christell Constant, MD.     Allergies:   Patient has no known allergies.   Social History   Socioeconomic History  . Marital status: Married    Spouse name: Not on file  . Number of children: Not on file  . Years of education: Not on file  . Highest education level: Not on file  Occupational History  . Not on file  Tobacco Use  . Smoking status: Former Smoker    Quit date: 01/29/1992    Years since quitting: 28.2  . Smokeless tobacco: Never Used  Vaping Use  . Vaping Use: Never used  Substance and Sexual Activity  . Alcohol use: Yes    Comment: very seldom- 1 every 3 months  . Drug use: Never  . Sexual activity: Not on file  Other Topics Concern  . Not on file  Social History Narrative  . Not on file   Social Determinants of Health   Financial Resource Strain: Not on file  Food Insecurity: Not on file  Transportation Needs: Not on file  Physical Activity: Not on file  Stress: Not on file  Social Connections: Not on file  Family History: The patient's family history includes Cancer in his mother. There is no history of Colon cancer, Colon polyps, Esophageal cancer, Rectal cancer, or Stomach cancer.  Father died in his sleep at age 19 Grandmother died of heart attack (both).  ROS:   Please see the history of present illness.    All other systems reviewed and are negative.  EKGs/Labs/Other Studies Reviewed:    The following studies were reviewed today:  EKG:   02/04/20: SR rate 88 RBBB 05/04/19: SR 76 RBBB  Cardiac Event Monitoring: Date: 04/19/20 Results:  Patient had a minimum heart rate of 60 bpm, maximum heart rate of 142 bpm, and average heart rate of 85 bpm.  Predominant underlying rhythm was sinus rhythm.  Isolated PVCs were rare (<1.0%).  No evidence of complete heart block or atrial fibrillation.  Triggered and diary events associated with PAC  bigeminy, sinus rhythm, sinus tachycardia and PVCs.   No return of atrial fibrillation.  NM Stress Testing: Date: 03/27/20 Results:  Lexiscan stress is electrically negative for ischemia  Myovue scan with inferior/inferolateral thinning consistent with diaphragm; cannot exclude subendocardial scar. No evidence for ischemai  LVEF is 56% with normal wall thickening  Overall low risk scan  This is a low risk study.   Recent Labs: 05/04/2019: BUN 28; Creatinine, Ser 0.93; Hemoglobin 13.5; Platelets 300; Potassium 4.3; Sodium 140 02/04/2020: NT-Pro BNP 49  Recent Lipid Panel No results found for: CHOL, TRIG, HDL, CHOLHDL, VLDL, LDLCALC, LDLDIRECT   OSH Lipids 01/07/20 Cholesterol 119 TGs 96 HDL 41 LDL 59 Creatinine 1.1 TSH 2.9   Risk Assessment/Calculations:     CHA2DS2-VASc Score = 3  This indicates a 3.2% annual risk of stroke. The patient's score is based upon: CHF History: No HTN History: Yes Diabetes History: Yes Stroke History: No Vascular Disease History: No Age Score: 1 Gender Score: 0   {FYI    This patient has a significant risk of stroke if diagnosed with atrial fibrillation.  Please consider VKA or DOAC agent for anticoagulation if the bleeding risk is acceptable.  You can also use the SmartPhrase .[email or web address] for documentation in the A&P of your note.   :[id number]}  Physical Exam:    VS:  There were no vitals taken for this visit.    Wt Readings from Last 3 Encounters:  03/27/20 261 lb (118.4 kg)  02/23/20 261 lb (118.4 kg)  02/04/20 262 lb (118.8 kg)     DTO:IZTIW, well developed in no acute distress HEENT: Normal NECK: No JVD; No carotid bruits LYMPHATICS: No lymphadenopathy CARDIAC: RRR, no murmurs, rubs, gallops RESPIRATORY:  Clear to auscultation without rales, wheezing or rhonchi  *** ABDOMEN: Soft, non-tender, non-distended MUSCULOSKELETAL:  Non-pitting edema R> L; No deformity  SKIN: Warm and dry NEUROLOGIC:  Alert and oriented x  3 PSYCHIATRIC:  Normal affect   ASSESSMENT:    No diagnosis found. PLAN:    In order of problems listed above:   Paroxsymal Atrial Fibrillation - CHADSVASC=3. - give his prior ablation and new symptoms; will place 30 day [employer] Monitor  Morbid Obesity, Diabetes with hypertension OSA on CPAP - ambulatory blood pressure controlled, will continue ambulatory BP monitoring; gave education on how to perform ambulatory blood pressure monitoring including the frequency and technique; goal ambulatory blood pressure < 135/85 on average - continue home medications  - discussed diet (DASH/low sodium), and exercise/weight loss interventions   Hyperlipidemia (familial) -LDL goal less than 100 -continue current statin - gave education on dietary changes  ***  follow up unless new symptoms or abnormal test results warranting change in plan  Would be reasonable for *** Video Visit Follow up  Would be reasonable for *** APP Follow up       Medication Adjustments/Labs and Tests Ordered: Current medicines are reviewed at length with the patient today.  Concerns regarding medicines are outlined above.  No orders of the defined types were placed in this encounter.  No orders of the defined types were placed in this encounter.   There are no Patient Instructions on file for this visit.   Signed, Christell Constant, MD  04/19/2020 7:17 AM    [facility]

## 2020-05-03 NOTE — Procedures (Signed)
S PATIENT'S NAME:  Bradley Ballard, Bradley Ballard DOB:      October 18, 1950      MR#:    [id number]     DATE OF RECORDING: 04/18/2020 REFERRING M.D.:  Carolin Coy, MD Study Performed:   CPAP  Titration HISTORY: 70 year old man with a history of diabetes, neuropathy, history of Bell's palsy, WPW, status post ablation, atrial fibrillation, BPH with status post TURP in April 2021, depression, cerebral palsy, hypertension, hyperlipidemia, reflux disease, and obesity, who presents for a full night titration study to treat his sleep apnea. His baseline sleep study from 03/12/2020 showed moderate to severe obstructive sleep apnea, with a total AHI of 22/hour, supine AHI of 43/hour and O2 nadir of 84%. The absence of REM sleep likely underestimated his sleep disordered breathing. The patient endorsed the Epworth Sleepiness Scale at 12 points. The patient's weight 261 pounds with a height of 71 (inches), resulting in a BMI of 36.4 kg/m2. The patient's neck circumference measured 19.5 inches.   CURRENT MEDICATIONS: Aspirin, Lipitor, Wellbutrin XL, Cymbalta, Proscar, Fenofibrate, Flonase, Neurontin, Advil, Glucophage-XR, Percocet, Revation, Flomax, Theratears, Prevident    PROCEDURE:  This is a multichannel digital polysomnogram utilizing the SomnoStar 11.2 system.  Electrodes and sensors were applied and monitored per AASM Specifications.   EEG, EOG, Chin and Limb EMG, were sampled at 200 Hz.  ECG, Snore and Nasal Pressure, Thermal Airflow, Respiratory Effort, CPAP Flow and Pressure, Oximetry was sampled at 50 Hz. Digital video and audio were recorded.      The patient was fitted with a large F20 FFM after trying nasal pillows. CPAP was initiated at 4 cmH20 with heated humidity per AASM standards and pressure was advanced to 11 cm H20 because of hypopneas, apneas and desaturations.  At a PAP pressure of 11 cmH20, there was a reduction of the AHI to 0.9/hour with supine REM sleep achieved and O2 nadir of 92%.   Lights Out was at 21:37  and Lights On at 05:23. Total recording time (TRT) was 466.5 minutes, with a total sleep time (TST) of 436 minutes. The patient's sleep latency was 1 minutes. REM latency was 122 minutes, which is mildly delayed.  The sleep efficiency was 93.5 %.    SLEEP ARCHITECTURE: WASO (Wake after sleep onset) was 29.5 minutes.  There were 4.5 minutes in Stage N1, 210 minutes Stage N2, 174.5 minutes Stage N3 and 47 minutes in Stage REM.  The percentage of Stage N1 was 1.%, Stage N2 was 48.2%, which is normal, Stage N3 was 40.%, which is increased, and Stage R (REM sleep) was 10.8%, which is reduced. The arousals were noted as: 13 were spontaneous, 13 were associated with PLMs, 23 were associated with respiratory events.  RESPIRATORY ANALYSIS:  There was a total of 31 respiratory events: 2 obstructive apneas, 2 central apneas and 0 mixed apneas with a total of 4 apneas and an apnea index (AI) of .6 /hour. There were 27 hypopneas with a hypopnea index of 3.7/hour. The patient also had 0 respiratory event related arousals (RERAs).      The total APNEA/HYPOPNEA INDEX  (AHI) was 4.3 /hour and the total RESPIRATORY DISTURBANCE INDEX was 4.3 /hour  1 events occurred in REM sleep and 30 events in NREM. The REM AHI was 1.3 /hour versus a non-REM AHI of 4.6 /hour.  The patient spent 188 minutes of total sleep time in the supine position and 248 minutes in non-supine. The supine AHI was 9.2, versus a non-supine AHI of 0.5.  OXYGEN SATURATION &  C02:  The baseline 02 saturation was 91%, with the lowest being 84%. Time spent below 89% saturation equaled 7 minutes.  PERIODIC LIMB MOVEMENTS:  The patient had a total of 770 Periodic Limb Movements. The Periodic Limb Movement (PLM) index was 106. and the PLM Arousal index was 1.8 /hour.  Audio and video analysis did not show any abnormal or unusual movements, behaviors, phonations or vocalizations. The patient took 2 bathroom breaks. The EKG was in keeping with normal sinus rhythm  (NSR).   Post-study, the patient indicated that sleep was better than usual.    IMPRESSION: 1. Obstructive Sleep Apnea (OSA) 2. Periodic Limb Movement Disorder (PLMD) 3. Dysfunctions associated with sleep stages or arousal from sleep   RECOMMENDATIONS: 1. This study demonstrates resolution of the patient's obstructive sleep apnea with CPAP therapy. I will, therefore, start the patient on home CPAP treatment at a pressure of 11 cm via large ResMed F20 full face mask with (heated) humidity. The patient will be advised to be fully compliant with PAP therapy to improve sleep related symptoms and decrease long term cardiovascular risks. The patient should be reminded, that it may take up to 3 months to get fully used to using PAP with all planned sleep. The earlier full compliance is achieved, the better long term compliance tends to be. Please note that untreated obstructive sleep apnea may carry additional perioperative morbidity. Patients with significant obstructive sleep apnea should receive perioperative PAP therapy and the surgeons and particularly the anesthesiologist should be informed of the diagnosis and the severity of the sleep disordered breathing. 2. Severe PLMs (periodic limb movements of sleep) were noted during this study without significant arousals; clinical correlation is recommended. Medication effect from the antidepressant medication should be considered. PLMs may improve with OSA treatment. 3. This study shows sleep fragmentation and abnormal sleep stage percentages; these are nonspecific findings and per se do not signify an intrinsic sleep disorder or a cause for the patient's sleep-related symptoms. Causes include (but are not limited to) the first night effect of the sleep study, circadian rhythm disturbances, medication effect or an underlying mood disorder or medical problem.  4. The patient will be seen in follow-up in the sleep clinic at [facility] for discussion of the test  results, symptom and treatment compliance review, further management strategies, etc. The referring provider will be notified of the test results.  I certify that I have reviewed the entire raw data recording prior to the issuance of this report in accordance with the Standards of Accreditation of the American Academy of Sleep Medicine (AASM)   Huston Foley, MD, PhD Diplomat, American Board of Neurology and Sleep Medicine (Neurology and Sleep Medicine)

## 2020-05-03 NOTE — Addendum Note (Signed)
Addended by: Huston Foley on: 05/03/2020 05:13 PM   Modules accepted: Orders

## 2020-05-03 NOTE — Progress Notes (Signed)
Patient referred by Dr. Clelia Croft, seen by me on 02/23/20, diagnostic PSG on 03/12/20.  Patient had a CPAP titration study on 04/18/20.  Please call and inform patient that I have entered an order for treatment with positive airway pressure (PAP) treatment for obstructive sleep apnea (OSA). He did well during the latest sleep study with CPAP. We will, therefore, arrange for a machine for home use through a DME (durable medical equipment) company of His choice; and I will see the patient back in follow-up in about 10 weeks. Please also explain to the patient that I will be looking out for compliance data, which can be downloaded from the machine (stored on an SD card, that is inserted in the machine) or via remote access through a modem, that is built into the machine. At the time of the followup appointment we will discuss sleep study results and how it is going with PAP treatment at home. Please advise patient to bring His machine at the time of the first FU visit, even though this is cumbersome. Bringing the machine for every visit after that will likely not be needed, but often helps for the first visit to troubleshoot if needed. Please re-enforce the importance of compliance with treatment and the need for [place] to monitor compliance data - often an insurance requirement and actually good feedback for the patient as far as how they are doing.  Also remind patient, that any interim PAP machine or mask issues should be first addressed with the DME company, as they can often help better with technical and mask fit issues. Please ask if patient has a preference regarding DME company.  Please also make sure, the patient has a follow-up appointment with me in about 10 weeks from the setup date, thanks. May see one of our nurse practitioners if needed for proper timing of the FU appointment.  Please fax or rout report to the referring provider. Thanks,   Huston Foley, MD, PhD [facility])

## 2020-05-04 ENCOUNTER — Telehealth: Payer: Self-pay

## 2020-05-04 NOTE — Telephone Encounter (Signed)
I called pt. No answer, left a message asking pt to call me back.   

## 2020-05-04 NOTE — Telephone Encounter (Signed)
-----   Message from Bradley Foley, MD sent at 05/03/2020  5:13 PM EDT ----- Patient referred by Dr. Clelia Croft, seen by me on 02/23/20, diagnostic PSG on 03/12/20.  Patient had a CPAP titration study on 04/18/20.  Please call and inform patient that I have entered an order for treatment with positive airway pressure (PAP) treatment for obstructive sleep apnea (OSA). He did well during the latest sleep study with CPAP. We will, therefore, arrange for a machine for home use through a DME (durable medical equipment) company of His choice; and I will see the patient back in follow-up in about 10 weeks. Please also explain to the patient that I will be looking out for compliance data, which can be downloaded from the machine (stored on an SD card, that is inserted in the machine) or via remote access through a modem, that is built into the machine. At the time of the followup appointment we will discuss sleep study results and how it is going with PAP treatment at home. Please advise patient to bring His machine at the time of the first FU visit, even though this is cumbersome. Bringing the machine for every visit after that will likely not be needed, but often helps for the first visit to troubleshoot if needed. Please re-enforce the importance of compliance with treatment and the need for [place] to monitor compliance data - often an insurance requirement and actually good feedback for the patient as far as how they are doing.  Also remind patient, that any interim PAP machine or mask issues should be first addressed with the DME company, as they can often help better with technical and mask fit issues. Please ask if patient has a preference regarding DME company.  Please also make sure, the patient has a follow-up appointment with me in about 10 weeks from the setup date, thanks. May see one of our nurse practitioners if needed for proper timing of the FU appointment.  Please fax or rout report to the referring provider. Thanks,    Bradley Foley, MD, PhD [facility])

## 2020-05-08 NOTE — Telephone Encounter (Signed)
Late Entry- 05/04/20 Pt returned my call. I advised pt that Dr. Frances Furbish reviewed their sleep study results and found that pt did well during CPAP titration study on CPAP. Dr. Frances Furbish recommends that pt start CPAP for treatemtn. I reviewed CPAP compliance expectations with the pt. Pt is agreeable to starting an CPAP. I advised pt that an order will be sent to a DME, [facility], and [facility] will call the pt within about one week after they file with the pt's insurance. [facility] will show the pt how to use the machine, fit for masks, and troubleshoot the auto-PAP if needed.Pt understands on the national shortage of CPAPs and knows start dates are ranging from 6-12 weeks out. Pt will CB and let us know once he has started his machine and we will schedule f/u. Pt advised a letter with this information will be mailed to his adress I verified with the pt that the address we have on file is correct. Pt verbalized understanding of results. Pt had no questions at this time but was encouraged to call back if questions arise. I have sent the order to [facility] and have received confirmation that they have received the order.

## 2020-05-08 NOTE — Telephone Encounter (Signed)
Pt returned call. Please call back when available. 

## 2020-05-09 NOTE — Telephone Encounter (Signed)
I called pt. No answer, left a message asking pt to call me back.   

## 2020-05-09 NOTE — Telephone Encounter (Signed)
Pt called me back. He sts he had received a vm and was unsure if it was from our office.  I advised I had no new information to advise on and that the order for CPAP has been sent to [facility]. I reiterated telephone call information from 05/04/20. Pt verbalized understanding and will wait to hear back from [facility].

## 2020-05-31 NOTE — Telephone Encounter (Signed)
Pt called in asking about update on his start date for his CPAP. I advised the pt he would need to call his DME ([facility]) and discuss. Pt was advised of [facility]'s number and sts he will call. Pt sts he is leaving next week and will be out of town for 6 months in [place]. Pt was advised I was unsure when his start date might be.

## 2021-01-16 ENCOUNTER — Telehealth: Payer: Self-pay | Admitting: *Deleted

## 2021-01-16 NOTE — Telephone Encounter (Signed)
I called pt.  He had moved to [place], will be back in June 2023 when his wife finishes her school year.  He has not gotten any medical care as yet.  He wanted the # to [facility] and I called and left phone # for him on his mobile.  I said once he know who he wants to go to we will be glad to refer him to them, with his records/ sleep study etc. (As they will need that to start him on treatment).

## 2021-01-16 NOTE — Telephone Encounter (Signed)
s, Alcide Clever, Fermin Schwab, RN Okay I have found a location in [place] and faxed all of his info to. I called him and let him know that he needs to find a DR there as he will need to see them between 31-90 days after he is setup.      Previous Messages   ----- Message -----  From: Bradley Begin, RN  Sent: 01/16/2021   1:15 PM EST  To: [name], [name], *  Subject: new user                                       [name]  Male, 70 y.o., [birth year]  MRN:  [id number]  Phone:  [phone]  He is moving to [place], [place].   I gave him your # , he was asking for it.  Electronics engineer

## 2021-01-16 NOTE — Telephone Encounter (Signed)
We received a message from [facility] stating the patient has not responded to any calls or messages so they are voiding the CPAP order. I have called the pt and LVM (ok per DPR) advising patient of this and also encouraged him to give the CPAP a try. Advised that CPAP is the gold standard of treatment for sleep apnea and if apnea is left untreated it can lead to memory trouble, headaches, daytime sleepiness, stroke, or heart attack to name a few. I left our office number in message offering to schedule him an appt to discuss alternative options or for him to request for [place] to resend the order to [facility] if he will try the CPAP.

## 2021-01-16 NOTE — Telephone Encounter (Signed)
Pt called states he has moved to [place]. If there is any way to refer him to a doctor out there he will be more than to do that other than that. He will not be back to [place].

## 2021-02-22 ENCOUNTER — Telehealth: Payer: Self-pay | Admitting: Neurology

## 2021-02-22 NOTE — Telephone Encounter (Signed)
Pt stated he was returning a call to [facility] because of him waiting on his CPAP.  Pt states he has not gotten anywhere where with [facility]. Please call.

## 2021-02-26 NOTE — Telephone Encounter (Signed)
Morrie Sheldon w/ [facility] confirmed the orders were sent to [facility] in [place]. Phone # [phone] . This is the number I gave the pt earlier. I called the pt and let him know to go ahead and call them and they should be able to process the order. He verbalized appreciation for the call.

## 2021-02-26 NOTE — Telephone Encounter (Signed)
Secure message sent to [facility] w/ [facility] to f/u. Looks like he was previously supposed to be transferred to [place] DME provider.

## 2021-02-26 NOTE — Telephone Encounter (Signed)
I spoke with the patient.  He states he was never called by anyone.  I told him I will reach out to [facility] with their care.  I also provided the number to [facility] in [place] which he states is close by.  He will give them a call to see if by chance they are the ones that has his order.  He states he was able to get a new doctor and he will see them in February.  I told him I will follow-up when I hear from [facility] and he also said he would give [place] a call should he be successful with this phone call to the [place] area care.

## 2021-10-07 IMAGING — US US ABDOMINAL AORTA SCREENING AAA
1 series · 7 of 7 positions shown · non-contrast
Comparison: None.

CLINICAL DATA: Male between 65-75 years of age with a smoking
history.

EXAM:
US ABDOMINAL AORTA MEDICARE SCREENING
TECHNIQUE: Ultrasound examination of the abdominal aorta was performed as a
screening evaluation for abdominal aortic aneurysm.

[Series 1: us abdominal aorta screening aaa · 0.27mm/px · 7 of 7 slices shown]
[im 1/7]
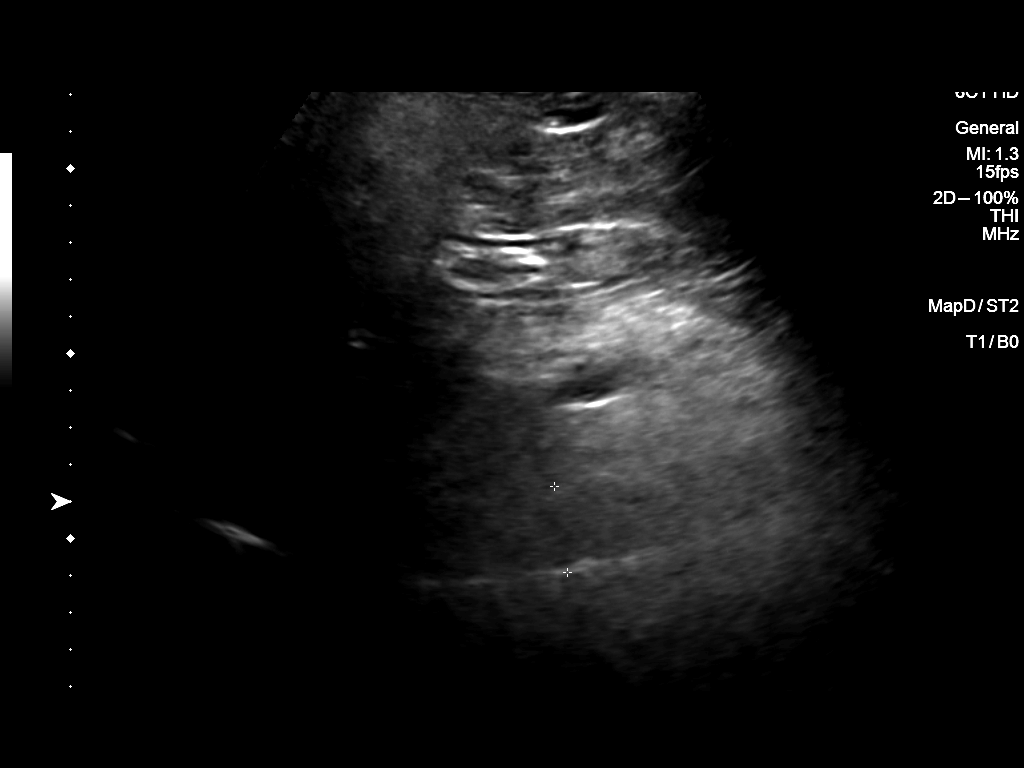
[im 2/7]
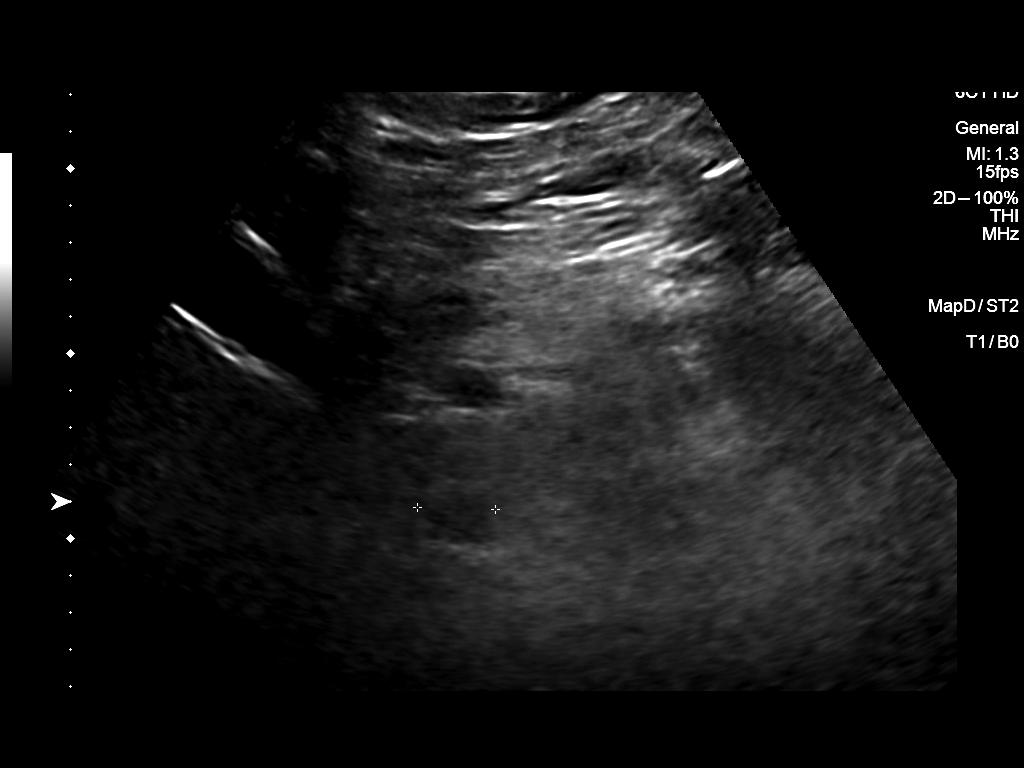
[im 3/7]
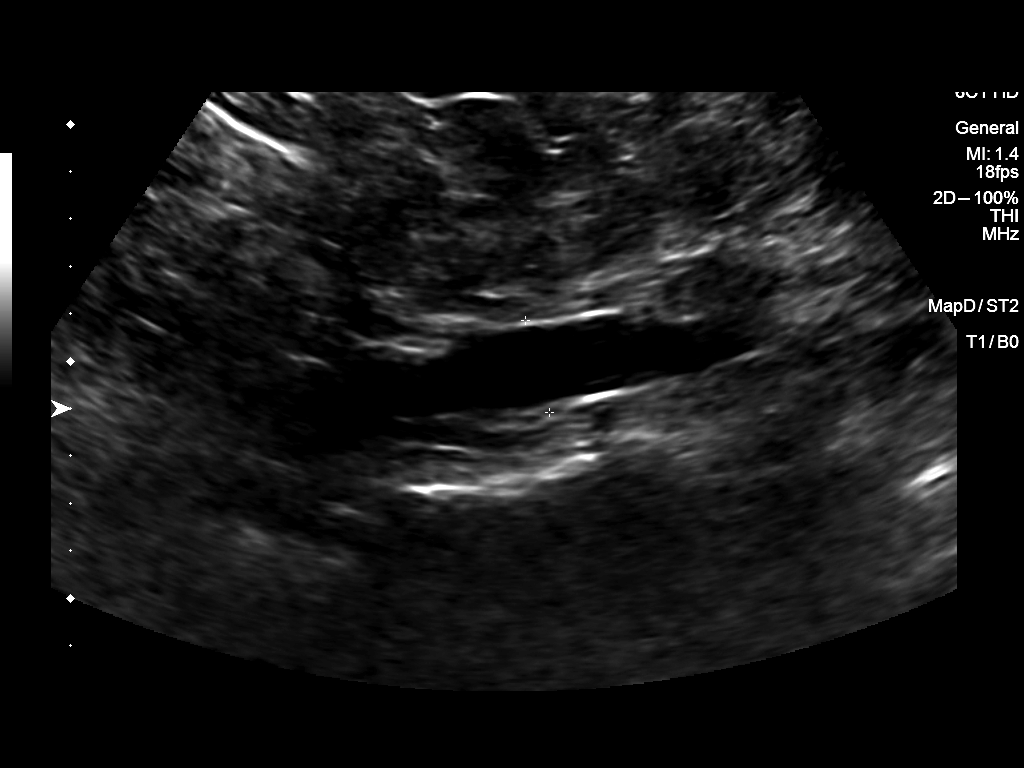
[im 4/7]
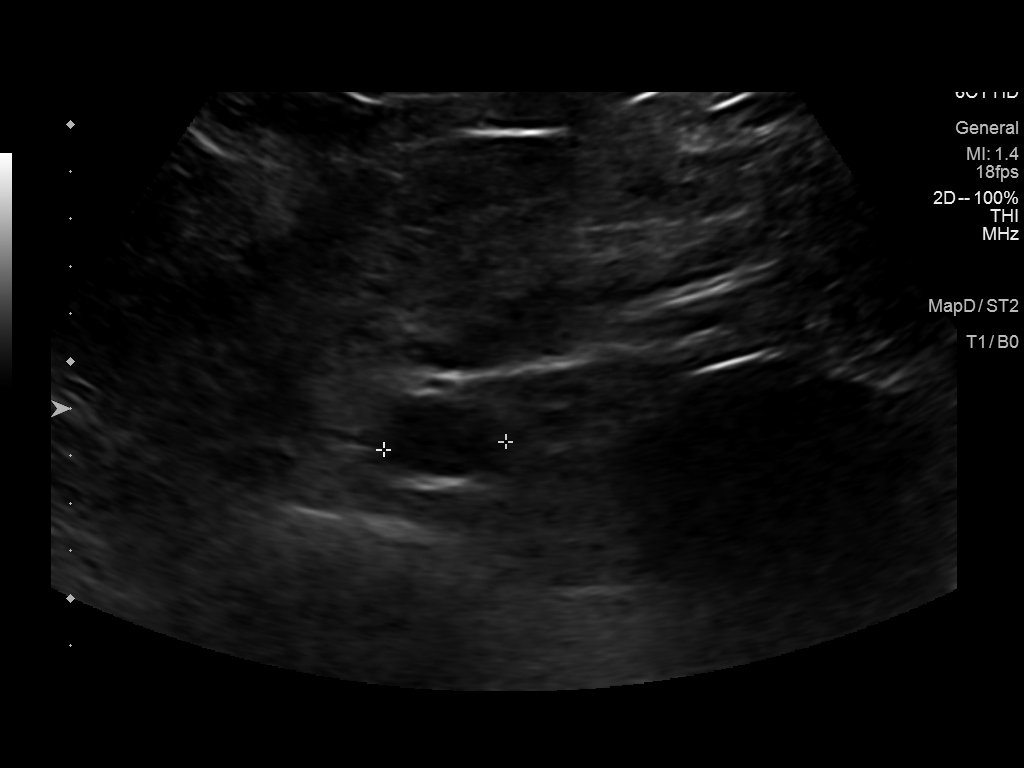
[im 5/7]
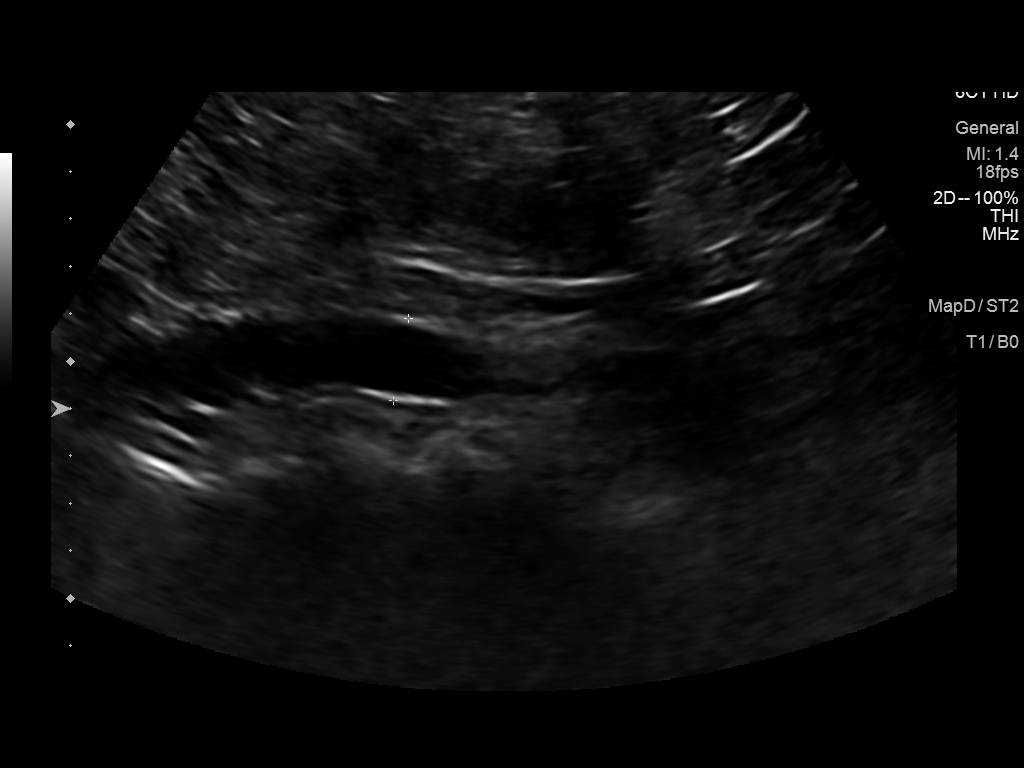
[im 6/7]
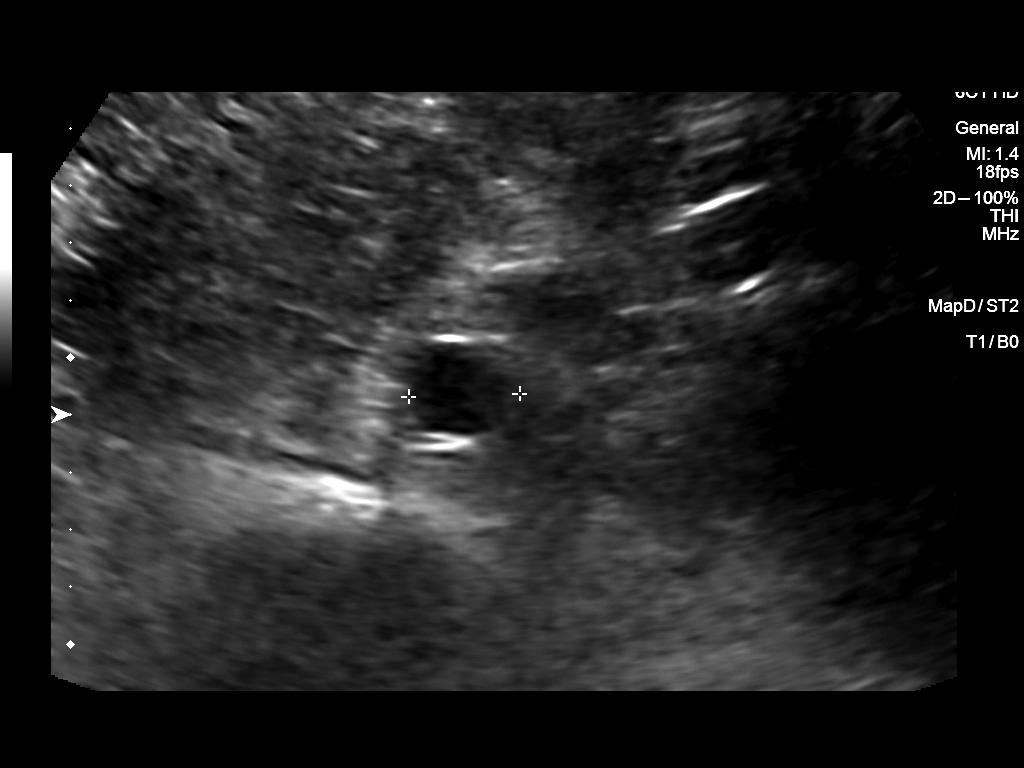
[im 7/7]
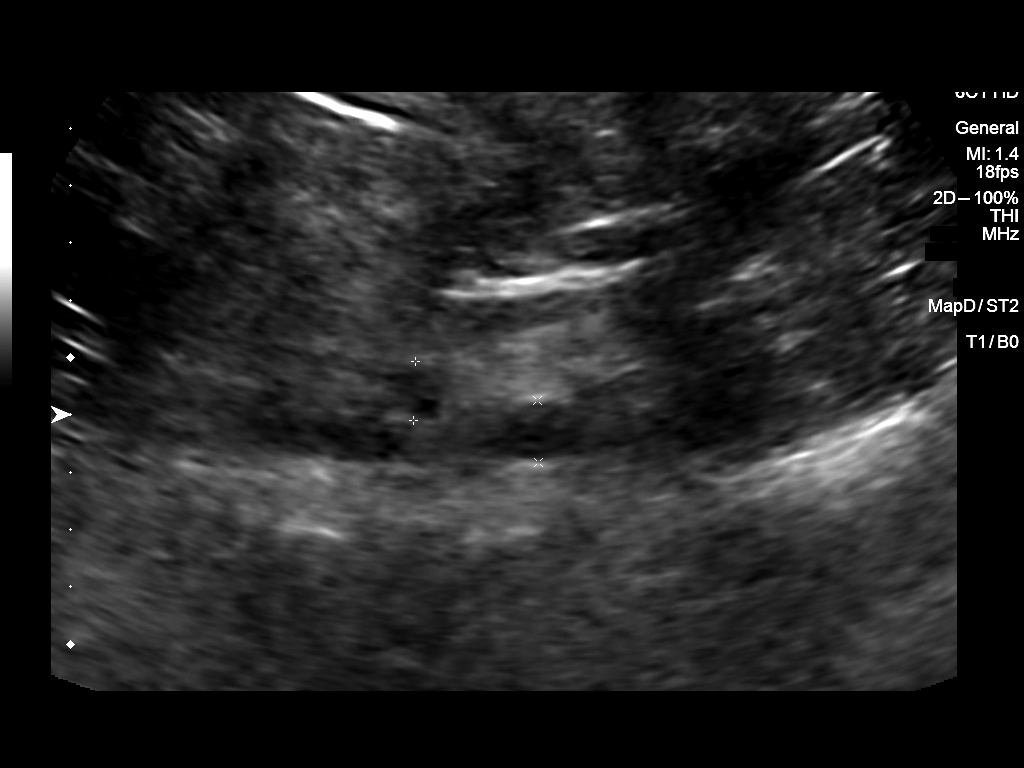

[7 of 7 positions shown; findings below may reference images not displayed]

FINDINGS: Abdominal aortic measurements as follows:

Proximal:  2.4 cm

Mid:  2.6 cm

Distal:  1.9 cm
IMPRESSION: No evidence of abdominal aortic aneurysm.
